# Patient Record
Sex: Female | Born: 1960 | Race: Black or African American | Hispanic: No | Marital: Married | Smoking: Former smoker
Health system: Southern US, Community
[De-identification: ages and names within clinical notes are randomized; demographics above are authoritative.]

## PROBLEM LIST (undated history)

## (undated) DIAGNOSIS — Z8639 Personal history of other endocrine, nutritional and metabolic disease: Secondary | ICD-10-CM

## (undated) DIAGNOSIS — K8020 Calculus of gallbladder without cholecystitis without obstruction: Secondary | ICD-10-CM

## (undated) DIAGNOSIS — E871 Hypo-osmolality and hyponatremia: Secondary | ICD-10-CM

## (undated) DIAGNOSIS — R202 Paresthesia of skin: Secondary | ICD-10-CM

## (undated) DIAGNOSIS — M1990 Unspecified osteoarthritis, unspecified site: Secondary | ICD-10-CM

## (undated) DIAGNOSIS — E119 Type 2 diabetes mellitus without complications: Secondary | ICD-10-CM

## (undated) DIAGNOSIS — F329 Major depressive disorder, single episode, unspecified: Secondary | ICD-10-CM

## (undated) DIAGNOSIS — K754 Autoimmune hepatitis: Secondary | ICD-10-CM

## (undated) DIAGNOSIS — F419 Anxiety disorder, unspecified: Secondary | ICD-10-CM

## (undated) DIAGNOSIS — L932 Other local lupus erythematosus: Secondary | ICD-10-CM

## (undated) DIAGNOSIS — K3532 Acute appendicitis with perforation and localized peritonitis, without abscess: Secondary | ICD-10-CM

## (undated) DIAGNOSIS — F32A Depression, unspecified: Secondary | ICD-10-CM

## (undated) DIAGNOSIS — I10 Essential (primary) hypertension: Secondary | ICD-10-CM

## (undated) DIAGNOSIS — E7800 Pure hypercholesterolemia, unspecified: Secondary | ICD-10-CM

## (undated) DIAGNOSIS — K760 Fatty (change of) liver, not elsewhere classified: Secondary | ICD-10-CM

## (undated) DIAGNOSIS — Z780 Asymptomatic menopausal state: Secondary | ICD-10-CM

## (undated) DIAGNOSIS — E876 Hypokalemia: Secondary | ICD-10-CM

## (undated) HISTORY — DX: Depression, unspecified: F32.A

## (undated) HISTORY — DX: Asymptomatic menopausal state: Z78.0

## (undated) HISTORY — DX: Major depressive disorder, single episode, unspecified: F32.9

## (undated) HISTORY — PX: CARPAL TUNNEL RELEASE: SHX101

## (undated) HISTORY — DX: Type 2 diabetes mellitus without complications: E11.9

## (undated) HISTORY — PX: TUBAL LIGATION: SHX77

## (undated) HISTORY — PX: JOINT REPLACEMENT: SHX530

## (undated) HISTORY — DX: Pure hypercholesterolemia, unspecified: E78.00

---

## 2006-02-06 ENCOUNTER — Ambulatory Visit: Payer: Self-pay | Admitting: Family Medicine

## 2012-05-15 ENCOUNTER — Ambulatory Visit: Payer: Self-pay | Admitting: Family Medicine

## 2014-06-24 ENCOUNTER — Ambulatory Visit: Payer: Self-pay | Admitting: Family Medicine

## 2015-08-20 ENCOUNTER — Other Ambulatory Visit: Payer: Self-pay | Admitting: Family Medicine

## 2015-08-20 DIAGNOSIS — Z1231 Encounter for screening mammogram for malignant neoplasm of breast: Secondary | ICD-10-CM

## 2015-10-09 ENCOUNTER — Ambulatory Visit
Admission: RE | Admit: 2015-10-09 | Discharge: 2015-10-09 | Disposition: A | Discharge status: Home / Self Care | Payer: No Typology Code available for payment source | Source: Ambulatory Visit | Attending: Family Medicine | Admitting: Family Medicine

## 2015-10-09 DIAGNOSIS — Z1231 Encounter for screening mammogram for malignant neoplasm of breast: Secondary | ICD-10-CM | POA: Diagnosis present

## 2016-04-07 ENCOUNTER — Inpatient Hospital Stay: Payer: Self-pay | Admitting: Oncology

## 2016-05-05 ENCOUNTER — Encounter: Payer: Self-pay | Admitting: Oncology

## 2016-05-05 ENCOUNTER — Inpatient Hospital Stay: Payer: BLUE CROSS/BLUE SHIELD | Attending: Oncology | Admitting: Oncology

## 2016-05-05 DIAGNOSIS — Z79899 Other long term (current) drug therapy: Secondary | ICD-10-CM | POA: Diagnosis not present

## 2016-05-05 DIAGNOSIS — Z8041 Family history of malignant neoplasm of ovary: Secondary | ICD-10-CM | POA: Diagnosis not present

## 2016-05-05 DIAGNOSIS — Z1371 Encounter for nonprocreative screening for genetic disease carrier status: Secondary | ICD-10-CM | POA: Diagnosis not present

## 2016-05-05 DIAGNOSIS — E7800 Pure hypercholesterolemia, unspecified: Secondary | ICD-10-CM | POA: Insufficient documentation

## 2016-05-05 DIAGNOSIS — E119 Type 2 diabetes mellitus without complications: Secondary | ICD-10-CM | POA: Insufficient documentation

## 2016-05-05 DIAGNOSIS — Z7984 Long term (current) use of oral hypoglycemic drugs: Secondary | ICD-10-CM | POA: Diagnosis not present

## 2016-05-05 DIAGNOSIS — F329 Major depressive disorder, single episode, unspecified: Secondary | ICD-10-CM | POA: Insufficient documentation

## 2016-05-05 NOTE — Progress Notes (Signed)
States is feeling well. Offers no complaints. 

## 2016-05-21 NOTE — Progress Notes (Signed)
[place]  Telephone:(336) [phone] Fax:(336) [phone]  ID: [name] OB: [birth year]  MR#: [id number]  [id number]  Patient Care Team: Herminio Commons, MD as PCP - General (Family Medicine)  CHIEF COMPLAINT: Genetic testing for family history of ovarian cancer. Chief Complaint  Patient presents with  . New Evaluation    INTERVAL HISTORY: Patient is a 55 year old female with no personal history of malignancy, but has a mother who had ovarian cancer in her 26s and her sister who had ovarian cancer in her 65s. She currently feels well and is asymptomatic. She has no neurologic complaints. She denies any fevers or illnesses. She has a good appetite and denies weight loss. She has no chest pain or shortness of breath. She denies any abdominal pain. She has no nausea, vomiting, constipation, or diarrhea. She has no urinary complaints. Patient feels at her baseline and offers no specific complaints today.  REVIEW OF SYSTEMS:   Review of Systems  Constitutional: Negative.  Negative for fever, weight loss and malaise/fatigue.  Respiratory: Negative for shortness of breath.   Cardiovascular: Negative.  Negative for chest pain.  Gastrointestinal: Negative.  Negative for abdominal pain.  Genitourinary: Negative.   Musculoskeletal: Negative.   Neurological: Negative.  Negative for weakness.  Psychiatric/Behavioral: Negative.     As per HPI. Otherwise, a complete review of systems is negatve.  PAST MEDICAL HISTORY: Past Medical History  Diagnosis Date  . Postmenopausal     LMP 2012  . Diabetes ([place])   . High cholesterol   . Depression     PAST SURGICAL HISTORY: Past Surgical History  Procedure Laterality Date  . Tubal ligation      FAMILY HISTORY Family History  Problem Relation Age of Onset  . Fibroids Sister 19  . Uterine cancer Sister 3  . Uterine cancer Mother 32  . Lung cancer Brother 97  . Bone cancer Brother 69       ADVANCED  DIRECTIVES:    HEALTH MAINTENANCE: Social History  Substance Use Topics  . Smoking status: Not on file  . Smokeless tobacco: Not on file  . Alcohol Use: Not on file     Colonoscopy:  PAP:  Bone density:  Lipid panel:  Allergies  Allergen Reactions  . Penicillin G Rash    Current Outpatient Prescriptions  Medication Sig Dispense Refill  . atorvastatin (LIPITOR) 40 MG tablet Take 1 tablet by mouth daily.  10  . metFORMIN (GLUCOPHAGE) 1000 MG tablet Take 1 tablet by mouth daily.  4  . sertraline (ZOLOFT) 50 MG tablet Take 1 tablet by mouth daily.  1   No current facility-administered medications for this visit.    OBJECTIVE: Filed Vitals:   05/05/16 1517  BP: 123/84  Pulse: 85  Temp: 95 F (35 C)  Resp: 18     There is no height on file to calculate BMI.    ECOG FS:0 - Asymptomatic  General: Well-developed, well-nourished, no acute distress. Eyes: Pink conjunctiva, anicteric sclera. Musculoskeletal: No edema, cyanosis, or clubbing. Neuro: Alert, answering all questions appropriately. Cranial nerves grossly intact. Skin: No rashes or petechiae noted. Psych: Normal affect.   LAB RESULTS:  No results found for: NA, K, CL, CO2, GLUCOSE, BUN, CREATININE, CALCIUM, PROT, ALBUMIN, AST, ALT, ALKPHOS, BILITOT, GFRNONAA, GFRAA  No results found for: WBC, NEUTROABS, HGB, HCT, MCV, PLT   STUDIES: No results found.  ASSESSMENT: Genetic testing for significant family history of ovarian cancer.   PLAN:    1.  Family history: Patient's mother had ovarian cancer in her 2s and her sister had ovarian cancer in her 23s. To her knowledge, neither have been genetically tested. She also had a brother with lung cancer at the age of 69 and a second brother with "bone cancer". Patient has no personal history of cancer. Patient will qualify for genetic testing and the My Risk panel has been ordered. If genetic testing is negative, patient expressed understanding that she still has a  higher risk of developing malignancy, particularly ovarian cancer, then the general population given her family history. If patient's genetic testing is positive, she has indicated that she likely would pursue prophylactic total hysterectomy and oophorectomy. If negative, no follow-up is necessary. If positive, patient will return to clinic to discuss the results as well as prophylactic measure she can take.  Approximately 45 minutes was spent in discussion of which greater than 50% was consultation.  Patient expressed understanding and was in agreement with this plan. She also understands that She can call clinic at any time with any questions, concerns, or complaints.   Lloyd Huger, MD   05/21/2016 6:26 PM

## 2016-08-03 ENCOUNTER — Other Ambulatory Visit: Payer: Self-pay | Admitting: Family Medicine

## 2016-08-03 DIAGNOSIS — R945 Abnormal results of liver function studies: Secondary | ICD-10-CM

## 2016-08-11 ENCOUNTER — Ambulatory Visit: Payer: BLUE CROSS/BLUE SHIELD

## 2016-08-12 ENCOUNTER — Ambulatory Visit
Admission: RE | Admit: 2016-08-12 | Discharge: 2016-08-12 | Disposition: A | Discharge status: Home / Self Care | Payer: BLUE CROSS/BLUE SHIELD | Source: Ambulatory Visit | Attending: Family Medicine | Admitting: Family Medicine

## 2016-08-12 DIAGNOSIS — R7989 Other specified abnormal findings of blood chemistry: Secondary | ICD-10-CM | POA: Diagnosis present

## 2016-08-12 DIAGNOSIS — R945 Abnormal results of liver function studies: Secondary | ICD-10-CM

## 2016-09-12 ENCOUNTER — Other Ambulatory Visit: Payer: Self-pay | Admitting: Family Medicine

## 2016-09-12 DIAGNOSIS — Z1231 Encounter for screening mammogram for malignant neoplasm of breast: Secondary | ICD-10-CM

## 2016-10-11 ENCOUNTER — Ambulatory Visit
Admission: RE | Admit: 2016-10-11 | Discharge: 2016-10-11 | Disposition: A | Discharge status: Home / Self Care | Payer: BLUE CROSS/BLUE SHIELD | Source: Ambulatory Visit | Attending: Family Medicine | Admitting: Family Medicine

## 2016-10-11 DIAGNOSIS — Z1231 Encounter for screening mammogram for malignant neoplasm of breast: Secondary | ICD-10-CM

## 2017-11-08 ENCOUNTER — Other Ambulatory Visit: Payer: Self-pay | Admitting: Family Medicine

## 2017-11-08 DIAGNOSIS — Z1231 Encounter for screening mammogram for malignant neoplasm of breast: Secondary | ICD-10-CM

## 2017-11-28 ENCOUNTER — Other Ambulatory Visit: Payer: Self-pay

## 2017-11-28 ENCOUNTER — Emergency Department
Admission: EM | Admit: 2017-11-28 | Discharge: 2017-11-28 | Disposition: A | Discharge status: Home / Self Care | Payer: BLUE CROSS/BLUE SHIELD | Attending: Emergency Medicine | Admitting: Emergency Medicine

## 2017-11-28 ENCOUNTER — Encounter: Payer: Self-pay | Admitting: Emergency Medicine

## 2017-11-28 DIAGNOSIS — K047 Periapical abscess without sinus: Secondary | ICD-10-CM | POA: Insufficient documentation

## 2017-11-28 DIAGNOSIS — R6884 Jaw pain: Secondary | ICD-10-CM | POA: Diagnosis present

## 2017-11-28 DIAGNOSIS — E119 Type 2 diabetes mellitus without complications: Secondary | ICD-10-CM | POA: Diagnosis not present

## 2017-11-28 LAB — GLUCOSE, CAPILLARY: GLUCOSE-CAPILLARY: 111 mg/dL — AB (ref 65–99)

## 2017-11-28 MED ORDER — CLINDAMYCIN HCL 150 MG PO CAPS
300.0000 mg | ORAL_CAPSULE | Freq: Once | ORAL | Status: AC
  Administered 2017-11-28: 300 mg via ORAL
  Filled 2017-11-28: qty 2

## 2017-11-28 MED ORDER — CLINDAMYCIN HCL 300 MG PO CAPS: 300.0000 mg | ORAL_CAPSULE | Freq: Three times a day (TID) | ORAL | 0 refills | Status: DC

## 2017-11-28 MED ORDER — OXYCODONE-ACETAMINOPHEN 5-325 MG PO TABS
2.0000 | ORAL_TABLET | Freq: Once | ORAL | Status: AC
  Administered 2017-11-28: 2 via ORAL
  Filled 2017-11-28: qty 2

## 2017-11-28 MED ORDER — TRAMADOL HCL 50 MG PO TABS: 50.0000 mg | ORAL_TABLET | Freq: Four times a day (QID) | ORAL | 0 refills | Status: AC | PRN

## 2017-11-28 NOTE — ED Notes (Signed)
Computer in room not working at time of discharge; unable to obtain electronic signature due to this reason.  Pt expresses verbal understanding of paper work w/ no further questions at this time.

## 2017-11-28 NOTE — ED Provider Notes (Signed)
[facility] Emergency Department Provider Note       Time seen: ----------------------------------------- 7:21 AM on 11/28/2017 -----------------------------------------   I have reviewed the triage vital signs and the nursing notes.  HISTORY   Chief Complaint Jaw Pain    HPI Jamie [name] is a 57 y.o. female with a history of diabetes, high cholesterol and depression who presents to the ED for right-sided jaw swelling and pain.  Pain is 8 out of 10, any pressure on her jaw makes her pain worse.  Pain seemed to start on Saturday and progressed into Sunday.  She presents for further evaluation.  She has a dentist appointment in 6 days.  Past Medical History:  Diagnosis Date  . Depression   . Diabetes ([place])   . High cholesterol   . Postmenopausal    LMP 2012    There are no active problems to display for this patient.   Past Surgical History:  Procedure Laterality Date  . TUBAL LIGATION      Allergies Penicillin g  Social History Social History   Tobacco Use  . Smoking status: Not on file  Substance Use Topics  . Alcohol use: Not on file  . Drug use: Not on file    Review of Systems Constitutional: Negative for fever. ENT: Positive for jaw pain and toothache Cardiovascular: Negative for chest pain. Respiratory: Negative for shortness of breath. Musculoskeletal: Negative for back pain. Skin: Positive for right-sided jaw swelling Neurological: Negative for headaches, focal weakness or numbness.  All systems negative/normal/unremarkable except as stated in the HPI  ____________________________________________   PHYSICAL EXAM:  VITAL SIGNS: ED Triage Vitals [11/28/17 0623]  Enc Vitals Group     BP      Pulse      Resp      Temp      Temp src      SpO2      Weight 174 lb (78.9 kg)     Height 5\' 6"  (1.676 m)     Head Circumference      Peak Flow      Pain Score 8     Pain Loc      Pain Edu?      Excl. in [place]?      Constitutional: Alert and oriented. Well appearing and in no distress. Eyes: Conjunctivae are normal. Normal extraocular movements. ENT   Head: Normocephalic and atraumatic.   Nose: No congestion/rhinnorhea.   Mouth/Throat: Widespread tooth decay, obvious dental fracture and dental caries.  Particular tenderness over tooth number 27 with likely periapical abscess formation.  Perimandibular erythema and swelling on the right anteriorly   Neck: No stridor, negative for mass or swelling Cardiovascular: Normal rate, regular rhythm. No murmurs, rubs, or gallops. Respiratory: Normal respiratory effort without tachypnea nor retractions. Breath sounds are clear and equal bilaterally. No wheezes/rales/rhonchi. Musculoskeletal: Nontender with normal range of motion in extremities. No lower extremity tenderness nor edema. Neurologic:  Normal speech and language. No gross focal neurologic deficits are appreciated.  Skin: Right-sided jaw swelling and erythema ____________________________________________  ED COURSE:  As part of my medical decision making, I reviewed the following data within the [place] History obtained from family if available, nursing notes, old chart and ekg, as well as notes from prior ED visits. Patient presented for an tubular swelling and pain, findings represent dental abscess   Procedures ____________________________________________  DIFFERENTIAL DIAGNOSIS   Dental abscess, tooth decay, hyperglycemia  FINAL ASSESSMENT AND PLAN  Dental abscess   Plan: Patient had presented for a dental abscess and received oral antibiotics and pain medicine. Patient's labs revealed normal glycemia.  I have encouraged her to schedule a sooner appointment with dentistry but overall she is stable for discharge with antibiotics and pain medicine.   Earleen Newport, MD   Note: This note was generated in part or whole with voice recognition  software. Voice recognition is usually quite accurate but there are transcription errors that can and very often do occur. I apologize for any typographical errors that were not detected and corrected.     Earleen Newport, MD 11/28/17 0800

## 2017-11-28 NOTE — ED Triage Notes (Signed)
Patient ambulatory to triage with steady gait, without difficulty or distress noted; pt reports awoke on Sunday with swelling to right lower jaw--redness noted

## 2018-05-22 ENCOUNTER — Other Ambulatory Visit: Payer: Self-pay | Admitting: Family Medicine

## 2018-05-22 DIAGNOSIS — Z1231 Encounter for screening mammogram for malignant neoplasm of breast: Secondary | ICD-10-CM

## 2018-06-15 ENCOUNTER — Ambulatory Visit
Admission: RE | Admit: 2018-06-15 | Discharge: 2018-06-15 | Disposition: A | Discharge status: Home / Self Care | Payer: BLUE CROSS/BLUE SHIELD | Source: Ambulatory Visit | Attending: Family Medicine | Admitting: Family Medicine

## 2018-06-15 DIAGNOSIS — Z1231 Encounter for screening mammogram for malignant neoplasm of breast: Secondary | ICD-10-CM | POA: Insufficient documentation

## 2019-05-09 ENCOUNTER — Other Ambulatory Visit: Payer: Self-pay | Admitting: Family Medicine

## 2019-05-09 DIAGNOSIS — Z1231 Encounter for screening mammogram for malignant neoplasm of breast: Secondary | ICD-10-CM

## 2019-06-20 ENCOUNTER — Ambulatory Visit
Admission: RE | Admit: 2019-06-20 | Discharge: 2019-06-20 | Disposition: A | Discharge status: Home / Self Care | Payer: BLUE CROSS/BLUE SHIELD | Source: Ambulatory Visit | Attending: Family Medicine | Admitting: Family Medicine

## 2019-06-20 ENCOUNTER — Other Ambulatory Visit: Payer: Self-pay

## 2019-06-20 DIAGNOSIS — Z1231 Encounter for screening mammogram for malignant neoplasm of breast: Secondary | ICD-10-CM | POA: Insufficient documentation

## 2019-08-28 ENCOUNTER — Encounter
Admission: RE | Admit: 2019-08-28 | Discharge: 2019-08-28 | Disposition: A | Discharge status: Home / Self Care | Payer: BLUE CROSS/BLUE SHIELD | Source: Ambulatory Visit | Attending: Orthopedic Surgery | Admitting: Orthopedic Surgery

## 2019-08-28 ENCOUNTER — Other Ambulatory Visit: Payer: Self-pay

## 2019-08-28 DIAGNOSIS — Z01818 Encounter for other preprocedural examination: Secondary | ICD-10-CM | POA: Diagnosis not present

## 2019-08-28 DIAGNOSIS — E118 Type 2 diabetes mellitus with unspecified complications: Secondary | ICD-10-CM | POA: Diagnosis not present

## 2019-08-28 DIAGNOSIS — E785 Hyperlipidemia, unspecified: Secondary | ICD-10-CM | POA: Diagnosis not present

## 2019-08-28 DIAGNOSIS — Z01810 Encounter for preprocedural cardiovascular examination: Secondary | ICD-10-CM | POA: Diagnosis not present

## 2019-08-28 DIAGNOSIS — I10 Essential (primary) hypertension: Secondary | ICD-10-CM | POA: Diagnosis not present

## 2019-08-28 HISTORY — DX: Unspecified osteoarthritis, unspecified site: M19.90

## 2019-08-28 HISTORY — DX: Essential (primary) hypertension: I10

## 2019-08-28 LAB — URINALYSIS, ROUTINE W REFLEX MICROSCOPIC
Bacteria, UA: NONE SEEN
Bilirubin Urine: NEGATIVE
Glucose, UA: NEGATIVE mg/dL
Hgb urine dipstick: NEGATIVE
Ketones, ur: NEGATIVE mg/dL
Nitrite: NEGATIVE
Protein, ur: NEGATIVE mg/dL
Specific Gravity, Urine: 1.032 — ABNORMAL HIGH (ref 1.005–1.030)
pH: 5 (ref 5.0–8.0)

## 2019-08-28 LAB — BASIC METABOLIC PANEL
Anion gap: 9 (ref 5–15)
BUN: 20 mg/dL (ref 6–20)
CO2: 29 mmol/L (ref 22–32)
Calcium: 9.6 mg/dL (ref 8.9–10.3)
Chloride: 101 mmol/L (ref 98–111)
Creatinine, Ser: 0.75 mg/dL (ref 0.44–1.00)
GFR calc Af Amer: >60 mL/min (ref >60)
GFR calc non Af Amer: >60 mL/min (ref >60)
Glucose, Bld: 82 mg/dL (ref 70–99)
Potassium: 4.2 mmol/L (ref 3.5–5.1)
Sodium: 139 mmol/L (ref 135–145)

## 2019-08-28 LAB — PROTIME-INR
INR: 0.9 (ref 0.8–1.2)
Prothrombin Time: 12.5 seconds (ref 11.4–15.2)

## 2019-08-28 LAB — TYPE AND SCREEN
ABO/RH(D): A NEG
Antibody Screen: NEGATIVE

## 2019-08-28 LAB — SURGICAL PCR SCREEN
MRSA, PCR: NEGATIVE
Staphylococcus aureus: NEGATIVE

## 2019-08-28 LAB — CBC
HCT: 36.1 % (ref 36.0–46.0)
Hemoglobin: 11.6 g/dL — ABNORMAL LOW (ref 12.0–15.0)
MCH: 27.8 pg (ref 26.0–34.0)
MCHC: 32.1 g/dL (ref 30.0–36.0)
MCV: 86.4 fL (ref 80.0–100.0)
Platelets: 238 10*3/uL (ref 150–400)
RBC: 4.18 MIL/uL (ref 3.87–5.11)
RDW: 12.6 % (ref 11.5–15.5)
WBC: 4.6 10*3/uL (ref 4.0–10.5)
nRBC: 0 % (ref 0.0–0.2)

## 2019-08-28 LAB — APTT: aPTT: 30 seconds (ref 24–36)

## 2019-08-28 LAB — SEDIMENTATION RATE: Sed Rate: 53 mm/hr — ABNORMAL HIGH (ref 0–30)

## 2019-08-28 NOTE — Patient Instructions (Signed)
Your procedure is scheduled on: 09/10/19 Report to [place]. To find out your arrival time please call [phone] between 1PM - 3PM on 1019/20.  Remember: Instructions that are not followed completely may result in serious medical risk, up to and including death, or upon the discretion of your surgeon and anesthesiologist your surgery may need to be rescheduled.     _X__ 1. Do not eat food after midnight the night before your procedure.                 No gum chewing or hard candies. You may drink clear liquids up to 2 hours                 before you are scheduled to arrive for your surgery- DO not drink clear                 liquids within 2 hours of the start of your surgery.                 Clear Liquids include:  water, apple juice without pulp, clear carbohydrate                 drink such as Clearfast or Gatorade, Black Coffee or Tea (Do not add                 anything to coffee or tea). Diabetics water only  __X__2.  On the morning of surgery brush your teeth with toothpaste and water, you                 may rinse your mouth with mouthwash if you wish.  Do not swallow any              toothpaste of mouthwash.     _X__ 3.  No Alcohol for 24 hours before or after surgery.   _X__ 4.  Do Not Smoke or use e-cigarettes For 24 Hours Prior to Your Surgery.                 Do not use any chewable tobacco products for at least 6 hours prior to                 surgery.  ____  5.  Bring all medications with you on the day of surgery if instructed.   __X__  6.  Notify your doctor if there is any change in your medical condition      (cold, fever, infections).     Do not wear jewelry, make-up, hairpins, clips or nail polish. Do not wear lotions, powders, or perfumes.  Do not shave 48 hours prior to surgery. Men may shave face and neck. Do not bring valuables to the hospital.    [facility] is not responsible for any belongings  or valuables.  Contacts, dentures/partials or body piercings may not be worn into surgery. Bring a case for your contacts, glasses or hearing aids, a denture cup will be supplied. Leave your suitcase in the car. After surgery it may be brought to your room. For patients admitted to the hospital, discharge time is determined by your treatment team.   Patients discharged the day of surgery will not be allowed to drive home.   Please read over the following fact sheets that you were given:   MRSA Information  __X__ Take these medicines the morning of surgery with A SIP OF WATER:  1. PARoxetine (PAXIL  2.   3.   4.  5.  6.  ____ Fleet Enema (as directed)   __X__ Use CHG Soap/SAGE wipes as directed  ____ Use inhalers on the day of surgery  __X__ Stop metformin/Janumet/Farxiga 2 days prior to surgery    ____ Take 1/2 of usual insulin dose the night before surgery. No insulin the morning          of surgery.   ____ Stop Blood Thinners Coumadin/Plavix/Xarelto/Pleta/Pradaxa/Eliquis/Effient/Aspirin  on   Or contact your Surgeon, Cardiologist or Medical Doctor regarding  ability to stop your blood thinners  __X__ Stop Anti-inflammatories 7 days before surgery such as Advil, Ibuprofen, Motrin,  BC or Goodies Powder, Naprosyn, Naproxen, Aleve, Aspirin   dICLOFENAC   __X__ Stop all herbal supplements, fish oil or vitamin E until after surgery.  VITAMIN D OK TO CONTINUE  ____ Bring C-Pap to the hospital.

## 2019-08-29 LAB — URINE CULTURE: Culture: NO GROWTH

## 2019-09-06 ENCOUNTER — Other Ambulatory Visit
Admission: RE | Admit: 2019-09-06 | Discharge: 2019-09-06 | Disposition: A | Discharge status: Home / Self Care | Payer: BLUE CROSS/BLUE SHIELD | Source: Ambulatory Visit | Attending: Orthopedic Surgery | Admitting: Orthopedic Surgery

## 2019-09-06 ENCOUNTER — Other Ambulatory Visit: Payer: Self-pay

## 2019-09-06 DIAGNOSIS — Z20828 Contact with and (suspected) exposure to other viral communicable diseases: Secondary | ICD-10-CM | POA: Insufficient documentation

## 2019-09-06 LAB — SARS CORONAVIRUS 2 (TAT 6-24 HRS): SARS Coronavirus 2: NEGATIVE

## 2019-09-10 ENCOUNTER — Encounter
Admission: RE | Disposition: A | Discharge status: Home Health | Payer: Self-pay | Source: Home / Self Care | Attending: Orthopedic Surgery

## 2019-09-10 ENCOUNTER — Inpatient Hospital Stay: Payer: BLUE CROSS/BLUE SHIELD | Admitting: Certified Registered"

## 2019-09-10 ENCOUNTER — Other Ambulatory Visit: Payer: Self-pay

## 2019-09-10 ENCOUNTER — Inpatient Hospital Stay: Payer: BLUE CROSS/BLUE SHIELD

## 2019-09-10 ENCOUNTER — Inpatient Hospital Stay
Admission: RE | Admit: 2019-09-10 | Discharge: 2019-09-12 | DRG: 470 | Disposition: A | Discharge status: Home Health | Payer: BLUE CROSS/BLUE SHIELD | Attending: Orthopedic Surgery | Admitting: Orthopedic Surgery

## 2019-09-10 DIAGNOSIS — Z8249 Family history of ischemic heart disease and other diseases of the circulatory system: Secondary | ICD-10-CM

## 2019-09-10 DIAGNOSIS — Z79899 Other long term (current) drug therapy: Secondary | ICD-10-CM | POA: Diagnosis not present

## 2019-09-10 DIAGNOSIS — I10 Essential (primary) hypertension: Secondary | ICD-10-CM | POA: Diagnosis present

## 2019-09-10 DIAGNOSIS — Z87891 Personal history of nicotine dependence: Secondary | ICD-10-CM | POA: Diagnosis not present

## 2019-09-10 DIAGNOSIS — E119 Type 2 diabetes mellitus without complications: Secondary | ICD-10-CM | POA: Diagnosis present

## 2019-09-10 DIAGNOSIS — E7800 Pure hypercholesterolemia, unspecified: Secondary | ICD-10-CM | POA: Diagnosis present

## 2019-09-10 DIAGNOSIS — Z880 Allergy status to penicillin: Secondary | ICD-10-CM

## 2019-09-10 DIAGNOSIS — Z881 Allergy status to other antibiotic agents status: Secondary | ICD-10-CM | POA: Diagnosis not present

## 2019-09-10 DIAGNOSIS — G8918 Other acute postprocedural pain: Secondary | ICD-10-CM

## 2019-09-10 DIAGNOSIS — M1612 Unilateral primary osteoarthritis, left hip: Principal | ICD-10-CM | POA: Diagnosis present

## 2019-09-10 DIAGNOSIS — Z23 Encounter for immunization: Secondary | ICD-10-CM | POA: Diagnosis not present

## 2019-09-10 DIAGNOSIS — Z96642 Presence of left artificial hip joint: Secondary | ICD-10-CM

## 2019-09-10 DIAGNOSIS — Z7984 Long term (current) use of oral hypoglycemic drugs: Secondary | ICD-10-CM | POA: Diagnosis not present

## 2019-09-10 DIAGNOSIS — E785 Hyperlipidemia, unspecified: Secondary | ICD-10-CM | POA: Diagnosis present

## 2019-09-10 DIAGNOSIS — Z419 Encounter for procedure for purposes other than remedying health state, unspecified: Secondary | ICD-10-CM

## 2019-09-10 HISTORY — PX: TOTAL HIP ARTHROPLASTY: SHX124

## 2019-09-10 LAB — CBC
HCT: 33.5 % — ABNORMAL LOW (ref 36.0–46.0)
Hemoglobin: 10.5 g/dL — ABNORMAL LOW (ref 12.0–15.0)
MCH: 27.6 pg (ref 26.0–34.0)
MCHC: 31.3 g/dL (ref 30.0–36.0)
MCV: 88.2 fL (ref 80.0–100.0)
Platelets: 192 10*3/uL (ref 150–400)
RBC: 3.8 MIL/uL — ABNORMAL LOW (ref 3.87–5.11)
RDW: 12.4 % (ref 11.5–15.5)
WBC: 12.5 10*3/uL — ABNORMAL HIGH (ref 4.0–10.5)
nRBC: 0 % (ref 0.0–0.2)

## 2019-09-10 LAB — ABO/RH: ABO/RH(D): A NEG

## 2019-09-10 LAB — CREATININE, SERUM
Creatinine, Ser: 0.59 mg/dL (ref 0.44–1.00)
GFR calc Af Amer: >60 mL/min (ref >60)
GFR calc non Af Amer: >60 mL/min (ref >60)

## 2019-09-10 LAB — GLUCOSE, CAPILLARY
Glucose-Capillary: 106 mg/dL — ABNORMAL HIGH (ref 70–99)
Glucose-Capillary: 107 mg/dL — ABNORMAL HIGH (ref 70–99)
Glucose-Capillary: 125 mg/dL — ABNORMAL HIGH (ref 70–99)
Glucose-Capillary: 132 mg/dL — ABNORMAL HIGH (ref 70–99)

## 2019-09-10 SURGERY — ARTHROPLASTY, HIP, TOTAL, ANTERIOR APPROACH
Anesthesia: Spinal | Site: Hip | Laterality: Left

## 2019-09-10 MED ORDER — HYDROCODONE-ACETAMINOPHEN 7.5-325 MG PO TABS: 1.0000 | ORAL_TABLET | ORAL | Status: DC | PRN

## 2019-09-10 MED ORDER — SODIUM CHLORIDE 0.9 % IV SOLN
INTRAVENOUS | Status: DC
  Administered 2019-09-10 – 2019-09-11 (×2): via INTRAVENOUS

## 2019-09-10 MED ORDER — PANTOPRAZOLE SODIUM 40 MG PO TBEC
40.0000 mg | DELAYED_RELEASE_TABLET | Freq: Every day | ORAL | Status: DC
  Administered 2019-09-10 – 2019-09-12 (×3): 40 mg via ORAL
  Filled 2019-09-10 (×3): qty 1

## 2019-09-10 MED ORDER — GLYCOPYRROLATE 0.2 MG/ML IJ SOLN
INTRAMUSCULAR | Status: AC
  Filled 2019-09-10: qty 2

## 2019-09-10 MED ORDER — INSULIN ASPART 100 UNIT/ML ~~LOC~~ SOLN: 0.0000 [IU] | Freq: Three times a day (TID) | SUBCUTANEOUS | Status: DC

## 2019-09-10 MED ORDER — ENOXAPARIN SODIUM 40 MG/0.4ML ~~LOC~~ SOLN
40.0000 mg | SUBCUTANEOUS | Status: DC
  Administered 2019-09-11 – 2019-09-12 (×2): 40 mg via SUBCUTANEOUS
  Filled 2019-09-10 (×2): qty 0.4

## 2019-09-10 MED ORDER — NEOMYCIN-POLYMYXIN B GU 40-200000 IR SOLN
Status: DC | PRN
  Administered 2019-09-10: 4 mL

## 2019-09-10 MED ORDER — ONDANSETRON HCL 4 MG/2ML IJ SOLN: 4.0000 mg | Freq: Four times a day (QID) | INTRAMUSCULAR | Status: DC | PRN

## 2019-09-10 MED ORDER — MORPHINE SULFATE (PF) 2 MG/ML IV SOLN: 0.5000 mg | INTRAVENOUS | Status: DC | PRN

## 2019-09-10 MED ORDER — ALUM & MAG HYDROXIDE-SIMETH 200-200-20 MG/5ML PO SUSP: 30.0000 mL | ORAL | Status: DC | PRN

## 2019-09-10 MED ORDER — METHOCARBAMOL 500 MG PO TABS: 500.0000 mg | ORAL_TABLET | Freq: Four times a day (QID) | ORAL | Status: DC | PRN

## 2019-09-10 MED ORDER — METOCLOPRAMIDE HCL 10 MG PO TABS: 5.0000 mg | ORAL_TABLET | Freq: Three times a day (TID) | ORAL | Status: DC | PRN

## 2019-09-10 MED ORDER — BUPIVACAINE LIPOSOME 1.3 % IJ SUSP
INTRAMUSCULAR | Status: AC
  Filled 2019-09-10: qty 20

## 2019-09-10 MED ORDER — BUPIVACAINE HCL (PF) 0.25 % IJ SOLN
INTRAMUSCULAR | Status: AC
  Filled 2019-09-10: qty 30

## 2019-09-10 MED ORDER — SODIUM CHLORIDE 0.9 % IV SOLN
INTRAVENOUS | Status: DC | PRN
  Administered 2019-09-10: 60 mL

## 2019-09-10 MED ORDER — FAMOTIDINE 20 MG PO TABS
20.0000 mg | ORAL_TABLET | Freq: Once | ORAL | Status: AC
  Administered 2019-09-10: 09:00:00 20 mg via ORAL

## 2019-09-10 MED ORDER — PHENYLEPHRINE HCL (PRESSORS) 10 MG/ML IV SOLN
INTRAVENOUS | Status: DC | PRN
  Administered 2019-09-10 (×4): 200 ug via INTRAVENOUS
  Administered 2019-09-10 (×3): 100 ug via INTRAVENOUS
  Administered 2019-09-10 (×3): 200 ug via INTRAVENOUS

## 2019-09-10 MED ORDER — ONDANSETRON HCL 4 MG/2ML IJ SOLN
INTRAMUSCULAR | Status: AC
  Filled 2019-09-10: qty 2

## 2019-09-10 MED ORDER — FENTANYL CITRATE (PF) 100 MCG/2ML IJ SOLN: 25.0000 ug | INTRAMUSCULAR | Status: DC | PRN

## 2019-09-10 MED ORDER — BUPIVACAINE HCL (PF) 0.5 % IJ SOLN
INTRAMUSCULAR | Status: DC | PRN
  Administered 2019-09-10: 3 mL via INTRATHECAL

## 2019-09-10 MED ORDER — PHENOL 1.4 % MT LIQD: 1.0000 | OROMUCOSAL | Status: DC | PRN

## 2019-09-10 MED ORDER — PROPOFOL 500 MG/50ML IV EMUL
INTRAVENOUS | Status: AC
  Filled 2019-09-10: qty 50

## 2019-09-10 MED ORDER — PAROXETINE HCL 10 MG PO TABS
10.0000 mg | ORAL_TABLET | Freq: Every day | ORAL | Status: DC
  Administered 2019-09-10 – 2019-09-12 (×3): 10 mg via ORAL
  Filled 2019-09-10 (×3): qty 1

## 2019-09-10 MED ORDER — MAGNESIUM CITRATE PO SOLN
1.0000 | Freq: Once | ORAL | Status: AC | PRN
  Administered 2019-09-12: 1 via ORAL
  Filled 2019-09-10: qty 296

## 2019-09-10 MED ORDER — ONDANSETRON HCL 4 MG PO TABS: 4.0000 mg | ORAL_TABLET | Freq: Four times a day (QID) | ORAL | Status: DC | PRN

## 2019-09-10 MED ORDER — DOCUSATE SODIUM 100 MG PO CAPS
100.0000 mg | ORAL_CAPSULE | Freq: Two times a day (BID) | ORAL | Status: DC
  Administered 2019-09-10 – 2019-09-12 (×4): 100 mg via ORAL
  Filled 2019-09-10 (×5): qty 1

## 2019-09-10 MED ORDER — NEOMYCIN-POLYMYXIN B GU 40-200000 IR SOLN
Status: AC
  Filled 2019-09-10: qty 20

## 2019-09-10 MED ORDER — EPINEPHRINE PF 1 MG/ML IJ SOLN
INTRAMUSCULAR | Status: AC
  Filled 2019-09-10: qty 1

## 2019-09-10 MED ORDER — METFORMIN HCL 500 MG PO TABS
1000.0000 mg | ORAL_TABLET | Freq: Every day | ORAL | Status: DC
  Administered 2019-09-11 – 2019-09-12 (×2): 1000 mg via ORAL
  Filled 2019-09-10 (×2): qty 2

## 2019-09-10 MED ORDER — BUPIVACAINE-EPINEPHRINE 0.25% -1:200000 IJ SOLN
INTRAMUSCULAR | Status: DC | PRN
  Administered 2019-09-10: 30 mL

## 2019-09-10 MED ORDER — INFLUENZA VAC SPLIT QUAD 0.5 ML IM SUSY
0.5000 mL | PREFILLED_SYRINGE | INTRAMUSCULAR | Status: AC
  Administered 2019-09-11: 0.5 mL via INTRAMUSCULAR
  Filled 2019-09-10: qty 0.5

## 2019-09-10 MED ORDER — MIDAZOLAM HCL 5 MG/5ML IJ SOLN
INTRAMUSCULAR | Status: DC | PRN
  Administered 2019-09-10 (×2): 1 mg via INTRAVENOUS

## 2019-09-10 MED ORDER — MAGNESIUM HYDROXIDE 400 MG/5ML PO SUSP
30.0000 mL | Freq: Every day | ORAL | Status: DC | PRN
  Administered 2019-09-11 (×2): 30 mL via ORAL
  Filled 2019-09-10 (×2): qty 30

## 2019-09-10 MED ORDER — HYDROCODONE-ACETAMINOPHEN 5-325 MG PO TABS
1.0000 | ORAL_TABLET | ORAL | Status: DC | PRN
  Administered 2019-09-10 – 2019-09-12 (×4): 2 via ORAL
  Filled 2019-09-10 (×4): qty 2

## 2019-09-10 MED ORDER — BISACODYL 10 MG RE SUPP
10.0000 mg | Freq: Every day | RECTAL | Status: DC | PRN
  Administered 2019-09-12: 10 mg via RECTAL
  Filled 2019-09-10: qty 1

## 2019-09-10 MED ORDER — ONDANSETRON HCL 4 MG/2ML IJ SOLN: 4.0000 mg | Freq: Once | INTRAMUSCULAR | Status: DC | PRN

## 2019-09-10 MED ORDER — ATORVASTATIN CALCIUM 20 MG PO TABS
40.0000 mg | ORAL_TABLET | Freq: Every day | ORAL | Status: DC
  Administered 2019-09-10 – 2019-09-11 (×2): 40 mg via ORAL
  Filled 2019-09-10: qty 2
  Filled 2019-09-10: qty 4

## 2019-09-10 MED ORDER — LISINOPRIL 10 MG PO TABS
10.0000 mg | ORAL_TABLET | Freq: Every day | ORAL | Status: DC
  Administered 2019-09-10 – 2019-09-12 (×3): 10 mg via ORAL
  Filled 2019-09-10 (×3): qty 1

## 2019-09-10 MED ORDER — METHOCARBAMOL 1000 MG/10ML IJ SOLN
500.0000 mg | Freq: Four times a day (QID) | INTRAVENOUS | Status: DC | PRN
  Filled 2019-09-10: qty 5

## 2019-09-10 MED ORDER — PROPOFOL 500 MG/50ML IV EMUL
INTRAVENOUS | Status: DC | PRN
  Administered 2019-09-10: 75 ug/kg/min via INTRAVENOUS

## 2019-09-10 MED ORDER — SODIUM CHLORIDE (PF) 0.9 % IJ SOLN
INTRAMUSCULAR | Status: AC
  Filled 2019-09-10: qty 50

## 2019-09-10 MED ORDER — VITAMIN D 25 MCG (1000 UNIT) PO TABS
1000.0000 [IU] | ORAL_TABLET | Freq: Every day | ORAL | Status: DC
  Administered 2019-09-11 – 2019-09-12 (×2): 1000 [IU] via ORAL
  Filled 2019-09-10 (×2): qty 1

## 2019-09-10 MED ORDER — VANCOMYCIN HCL IN DEXTROSE 1-5 GM/200ML-% IV SOLN
INTRAVENOUS | Status: AC
  Filled 2019-09-10: qty 200

## 2019-09-10 MED ORDER — GLYCOPYRROLATE 0.2 MG/ML IJ SOLN
INTRAMUSCULAR | Status: DC | PRN
  Administered 2019-09-10: 0.2 mg via INTRAVENOUS

## 2019-09-10 MED ORDER — VANCOMYCIN HCL IN DEXTROSE 1-5 GM/200ML-% IV SOLN
1000.0000 mg | Freq: Two times a day (BID) | INTRAVENOUS | Status: AC
  Administered 2019-09-10: 1000 mg via INTRAVENOUS
  Filled 2019-09-10: qty 200

## 2019-09-10 MED ORDER — FENTANYL CITRATE (PF) 100 MCG/2ML IJ SOLN
INTRAMUSCULAR | Status: DC | PRN
  Administered 2019-09-10 (×2): 50 ug via INTRAVENOUS

## 2019-09-10 MED ORDER — LIDOCAINE HCL (PF) 2 % IJ SOLN
INTRAMUSCULAR | Status: AC
  Filled 2019-09-10: qty 10

## 2019-09-10 MED ORDER — MENTHOL 3 MG MT LOZG: 1.0000 | LOZENGE | OROMUCOSAL | Status: DC | PRN

## 2019-09-10 MED ORDER — FENTANYL CITRATE (PF) 100 MCG/2ML IJ SOLN
INTRAMUSCULAR | Status: AC
  Filled 2019-09-10: qty 2

## 2019-09-10 MED ORDER — VANCOMYCIN HCL IN DEXTROSE 1-5 GM/200ML-% IV SOLN
1000.0000 mg | Freq: Once | INTRAVENOUS | Status: AC
  Administered 2019-09-10: 10:00:00 1000 mg via INTRAVENOUS

## 2019-09-10 MED ORDER — FAMOTIDINE 20 MG PO TABS
ORAL_TABLET | ORAL | Status: AC
  Administered 2019-09-10: 09:00:00 20 mg via ORAL
  Filled 2019-09-10: qty 1

## 2019-09-10 MED ORDER — TRANEXAMIC ACID-NACL 1000-0.7 MG/100ML-% IV SOLN
1000.0000 mg | INTRAVENOUS | Status: AC
  Administered 2019-09-10: 10:00:00 1000 mg via INTRAVENOUS
  Filled 2019-09-10: qty 100

## 2019-09-10 MED ORDER — ACETAMINOPHEN 325 MG PO TABS: 325.0000 mg | ORAL_TABLET | Freq: Four times a day (QID) | ORAL | Status: DC | PRN

## 2019-09-10 MED ORDER — GLYCOPYRROLATE 0.2 MG/ML IJ SOLN
INTRAMUSCULAR | Status: AC
  Filled 2019-09-10: qty 1

## 2019-09-10 MED ORDER — TRAMADOL HCL 50 MG PO TABS
50.0000 mg | ORAL_TABLET | Freq: Four times a day (QID) | ORAL | Status: DC
  Administered 2019-09-10 – 2019-09-12 (×8): 50 mg via ORAL
  Filled 2019-09-10 (×8): qty 1

## 2019-09-10 MED ORDER — MIDAZOLAM HCL 2 MG/2ML IJ SOLN
INTRAMUSCULAR | Status: AC
  Filled 2019-09-10: qty 2

## 2019-09-10 MED ORDER — METOCLOPRAMIDE HCL 5 MG/ML IJ SOLN: 5.0000 mg | Freq: Three times a day (TID) | INTRAMUSCULAR | Status: DC | PRN

## 2019-09-10 MED ORDER — BUPIVACAINE HCL (PF) 0.5 % IJ SOLN
INTRAMUSCULAR | Status: AC
  Filled 2019-09-10: qty 10

## 2019-09-10 MED ORDER — SODIUM CHLORIDE 0.9 % IV SOLN
INTRAVENOUS | Status: DC
  Administered 2019-09-10: 09:00:00 via INTRAVENOUS

## 2019-09-10 MED ORDER — ZOLPIDEM TARTRATE 5 MG PO TABS: 5.0000 mg | ORAL_TABLET | Freq: Every evening | ORAL | Status: DC | PRN

## 2019-09-10 SURGICAL SUPPLY — 62 items
BLADE SAGITTAL AGGR TOOTH XLG (BLADE) ×3 IMPLANT
BNDG COHESIVE 6X5 TAN STRL LF (GAUZE/BANDAGES/DRESSINGS) ×9 IMPLANT
CANISTER SUCT 1200ML W/VALVE (MISCELLANEOUS) ×3 IMPLANT
CANISTER WOUND CARE 500ML ATS (WOUND CARE) ×3 IMPLANT
CHLORAPREP W/TINT 26 (MISCELLANEOUS) ×3 IMPLANT
COVER BACK TABLE REUSABLE LG (DRAPES) ×3 IMPLANT
COVER WAND RF STERILE (DRAPES) ×3 IMPLANT
DRAPE 3/4 80X56 (DRAPES) ×9 IMPLANT
DRAPE C-ARM XRAY 36X54 (DRAPES) ×3 IMPLANT
DRAPE INCISE IOBAN 66X60 STRL (DRAPES) IMPLANT
DRAPE POUCH INSTRU U-SHP 10X18 (DRAPES) ×3 IMPLANT
DRESSING SURGICEL FIBRLLR 1X2 (HEMOSTASIS) ×2 IMPLANT
DRSG OPSITE POSTOP 4X8 (GAUZE/BANDAGES/DRESSINGS) ×6 IMPLANT
DRSG SURGICEL FIBRILLAR 1X2 (HEMOSTASIS) ×6
ELECT BLADE 6.5 EXT (BLADE) ×3 IMPLANT
ELECT REM PT RETURN 9FT ADLT (ELECTROSURGICAL) ×3
ELECTRODE REM PT RTRN 9FT ADLT (ELECTROSURGICAL) ×1 IMPLANT
GLOVE BIOGEL PI IND STRL 9 (GLOVE) ×1 IMPLANT
GLOVE BIOGEL PI INDICATOR 9 (GLOVE) ×2
GLOVE SURG SYN 9.0  PF PI (GLOVE) ×4
GLOVE SURG SYN 9.0 PF PI (GLOVE) ×2 IMPLANT
GOWN SRG 2XL LVL 4 RGLN SLV (GOWNS) ×1 IMPLANT
GOWN STRL NON-REIN 2XL LVL4 (GOWNS) ×2
GOWN STRL REUS W/ TWL LRG LVL3 (GOWN DISPOSABLE) ×1 IMPLANT
GOWN STRL REUS W/TWL LRG LVL3 (GOWN DISPOSABLE) ×2
HEAD FEMORAL 28MM SZ S (Head) ×2 IMPLANT
HEMOVAC 400CC 10FR (MISCELLANEOUS) IMPLANT
HOLDER FOLEY CATH W/STRAP (MISCELLANEOUS) ×3 IMPLANT
HOOD PEEL AWAY FLYTE STAYCOOL (MISCELLANEOUS) ×3 IMPLANT
KIT PREVENA INCISION MGT 13 (CANNISTER) ×3 IMPLANT
LINER DM 28MM (Liner) ×2 IMPLANT
MAT ABSORB  FLUID 56X50 GRAY (MISCELLANEOUS) ×2
MAT ABSORB FLUID 56X50 GRAY (MISCELLANEOUS) ×1 IMPLANT
NDL SAFETY ECLIPSE 18X1.5 (NEEDLE) ×1 IMPLANT
NDL SPNL 18GX3.5 QUINCKE PK (NEEDLE) IMPLANT
NDL SPNL 20GX3.5 QUINCKE YW (NEEDLE) ×2 IMPLANT
NEEDLE HYPO 18GX1.5 SHARP (NEEDLE) ×2
NEEDLE SPNL 18GX3.5 QUINCKE PK (NEEDLE) ×3 IMPLANT
NEEDLE SPNL 20GX3.5 QUINCKE YW (NEEDLE) ×6 IMPLANT
NS IRRIG 1000ML POUR BTL (IV SOLUTION) ×3 IMPLANT
PACK HIP COMPR (MISCELLANEOUS) ×3 IMPLANT
SCALPEL PROTECTED #10 DISP (BLADE) ×6 IMPLANT
SHELL ACETABULAR DM  48MM (Shell) ×2 IMPLANT
SOL PREP PVP 2OZ (MISCELLANEOUS) ×3
SOLUTION PREP PVP 2OZ (MISCELLANEOUS) ×1 IMPLANT
SPONGE DRAIN TRACH 4X4 STRL 2S (GAUZE/BANDAGES/DRESSINGS) ×3 IMPLANT
STAPLER SKIN PROX 35W (STAPLE) ×3 IMPLANT
STEM FEMORAL 1 STD COLLARED (Stem) ×2 IMPLANT
STRAP SAFETY 5IN WIDE (MISCELLANEOUS) ×3 IMPLANT
SUT DVC 2 QUILL PDO  T11 36X36 (SUTURE) ×2
SUT DVC 2 QUILL PDO T11 36X36 (SUTURE) ×1 IMPLANT
SUT SILK 0 (SUTURE) ×2
SUT SILK 0 30XBRD TIE 6 (SUTURE) ×1 IMPLANT
SUT V-LOC 90 ABS DVC 3-0 CL (SUTURE) ×3 IMPLANT
SUT VIC AB 1 CT1 36 (SUTURE) ×3 IMPLANT
SYR 20ML LL LF (SYRINGE) ×3 IMPLANT
SYR 30ML LL (SYRINGE) ×3 IMPLANT
SYR 50ML LL SCALE MARK (SYRINGE) ×6 IMPLANT
SYR BULB IRRIG 60ML STRL (SYRINGE) ×3 IMPLANT
TAPE MICROFOAM 4IN (TAPE) ×3 IMPLANT
TOWEL OR 17X26 4PK STRL BLUE (TOWEL DISPOSABLE) ×3 IMPLANT
TRAY FOLEY MTR SLVR 16FR STAT (SET/KITS/TRAYS/PACK) ×3 IMPLANT

## 2019-09-10 NOTE — H&P (Signed)
Reviewed paper H+P, will be scanned into chart. No changes noted.  

## 2019-09-10 NOTE — Anesthesia Procedure Notes (Signed)
Spinal  Patient location during procedure: OR Start time: 09/10/2019 9:50 AM End time: 09/10/2019 9:56 AM Staffing Anesthesiologist: Penwarden, Amy, MD Resident/CRNA: Axtell, Scott L, CRNA Performed: resident/CRNA and anesthesiologist  Preanesthetic Checklist Completed: patient identified, site marked, surgical consent, pre-op evaluation, timeout performed, IV checked, risks and benefits discussed and monitors and equipment checked Spinal Block Patient position: sitting Prep: ChloraPrep Patient monitoring: heart rate, continuous pulse ox and blood pressure Approach: midline Location: L4-5 Injection technique: single-shot Needle Needle type: Introducer and Pencil-Tip  Needle gauge: 24 G Needle length: 9 cm Additional Notes Negative paresthesia. Negative blood return. Positive free-flowing CSF. Expiration date of kit checked and confirmed. Patient tolerated procedure well, without complications.       

## 2019-09-10 NOTE — Anesthesia Post-op Follow-up Note (Signed)
Anesthesia QCDR form completed.        

## 2019-09-10 NOTE — Transfer of Care (Signed)
Immediate Anesthesia Transfer of Care Note  Patient: Jamie [name]  Procedure(s) Performed: TOTAL HIP ARTHROPLASTY ANTERIOR APPROACH (Left Hip)  Patient Location: PACU  Anesthesia Type:Spinal  Level of Consciousness: awake and drowsy  Airway & Oxygen Therapy: Patient Spontanous Breathing and Patient connected to nasal cannula oxygen  Post-op Assessment: Report given to RN and Post -op Vital signs reviewed and stable  Post vital signs: Reviewed and stable  Last Vitals:  Vitals Value Taken Time  BP 120/76 09/10/19 1126  Temp 36.2 C 09/10/19 1122  Pulse 58 09/10/19 1128  Resp 12 09/10/19 1128  SpO2 100 % 09/10/19 1128  Vitals shown include unvalidated device data.  Last Pain:  Vitals:   09/10/19 1122  TempSrc:   PainSc: 0-No pain         Complications: No apparent anesthesia complications

## 2019-09-10 NOTE — Op Note (Signed)
09/10/2019  11:20 AM  PATIENT:  Jamie [name]  58 y.o. female  PRE-OPERATIVE DIAGNOSIS:  PRIMARY OSTEOARTHRIITIS OF LEFT HIP  POST-OPERATIVE DIAGNOSIS:  PRIMARY OSTEOARTHRIITIS OF LEFT HIP  PROCEDURE:  Procedure(s): TOTAL HIP ARTHROPLASTY ANTERIOR APPROACH (Left)  SURGEON: Laurene Footman, MD  ASSISTANTS: None  ANESTHESIA:   spinal  EBL:  Total I/O In: -  Out: 300 [Urine:100; Blood:200]  BLOOD ADMINISTERED:none  DRAINS: none   LOCAL MEDICATIONS USED:  MARCAINE    and OTHER Exparel  SPECIMEN:  Source of Specimen:  Left femoral head  DISPOSITION OF SPECIMEN:  PATHOLOGY  COUNTS:  YES  TOURNIQUET:  * No tourniquets in log *  IMPLANTS: Medacta AMIS 1 standard stem, 48 mm mPACT DM cup and liner with ceramic S 28 mm head  DICTATION: .Dragon Dictation   The patient was brought to the operating room and after spinal anesthesia was obtained patient was placed on the operative table with the ipsilateral foot into the Medacta attachment, contralateral leg on a well-padded table. C-arm was brought in and preop template x-ray taken. After prepping and draping in usual sterile fashion appropriate patient identification and timeout procedures were completed. Anterior approach to the hip was obtained and centered over the greater trochanter and TFL muscle. The subcutaneous tissue was incised hemostasis being achieved by electrocautery. TFL fascia was incised and the muscle retracted laterally deep retractor placed. The lateral femoral circumflex vessels were identified and ligated. The anterior capsule was exposed and a capsulotomy performed. The neck was identified and a femoral neck cut carried out with a saw. The head was removed without difficulty and showed sclerotic femoral head and acetabulum. Reaming was carried out to 48 mm and a 48 mm cup trial gave appropriate tightness to the acetabular component a 48 DM cup was impacted into position. The leg was then externally rotated and  ischiofemoral and pubofemoral releases carried out. The femur was sequentially broached to a size 1, size 1 standard stem with S head trials were placed and the final components chosen. The 1 standard stem was inserted along with a ceramic S 28 mm head and 48 mm liner. The hip was reduced and was stable the wound was thoroughly irrigated with fibrillar placed along the posterior capsule and medial neck.  Exparel was injected throughout the case to aid in postop analgesia the deep fascia ws closed using a heavy Quill after infiltration of 30 cc of quarter percent Sensorcaine with epinephrine.3-0 V-loc to close the skin with skin staples.  Incisional wound VAC was applied and patient was sent to recovery in stable condition.   PLAN OF CARE: Admit to inpatient

## 2019-09-10 NOTE — TOC Benefit Eligibility Note (Signed)
Transition of Care [facility]) Benefit Eligibility Note    Patient Details  Name: Jamie [name] MRN: [id number] Date of Birth: February 26, 1961   Medication/Dose: Enoxaparin 55m once daily for 14 days  Covered?: Yes  Tier: 2 Drug  Prescription Coverage Preferred Pharmacy: [facility] Person/Company/Phone Number:: [facility] [employer] at [phone] Co-Pay: $10.00 estimated copay  Prior Approval: No  Deductible: (No deductible.  Individual out of pocket max is $2,700, of which $949.87 met as of time of call.)   HDannette BarbaraPhone Number: [phone] [phone]/20/2020, 9:44 AM

## 2019-09-10 NOTE — TOC Progression Note (Signed)
Transition of Care [facility]) - Progression Note    Patient Details  Name: Jamie [name] MRN: [id number] Date of Birth: [birth year]  Transition of Care [facility] CM/SW Contact  Su Hilt, RN Phone Number: 09/10/2019, 8:57 AM  Clinical Narrative:     Requested the price of Lovenox       Expected Discharge Plan and Services                                                 Social Determinants of Health (SDOH) Interventions    Readmission Risk Interventions No flowsheet data found.

## 2019-09-10 NOTE — Evaluation (Signed)
Physical Therapy Evaluation Patient Details Name: Jamie [name] MRN: [id number] DOB: 15-Jan-1961 Today's Date: 09/10/2019   History of Present Illness  Jamie [name] is a 39yoF who comes to [facility] on 10/20for elevative Left THA, direct anterior approach.  Clinical Impression  Pt admitted with above diagnosis. Pt currently with functional limitations due to the deficits listed below (see "PT Problem List"). Upon entry, pt in bed, awake and agreeable to participate. The pt is alert and oriented x4, pleasant, conversational, and generally a good historian. DTR at bedside throughout. Excellent performance of HEP with minimal intermittent assist only, mostly tactile and visual cuing. No physical assist required for bed mobility, transfers or ambulation, but pt cued heavily for technique and safety measures. Functional mobility assessment demonstrates increased effort/time requirements, poor tolerance, but no need for physical assistance, whereas the patient performed these at a higher level of independence PTA. Will issue HEP handout next date. Pt has no pain limitations. VSS during session. Pt will benefit from skilled PT intervention to increase independence and safety with basic mobility in preparation for discharge to the venue listed below.       Follow Up Recommendations Home health PT;Follow surgeon's recommendation for DC plan and follow-up therapies;Supervision - Intermittent    Equipment Recommendations  Rolling walker with 5" wheels    Recommendations for Other Services       Precautions / Restrictions Precautions Precautions: None;Fall Restrictions Weight Bearing Restrictions: Yes LLE Weight Bearing: Weight bearing as tolerated      Mobility  Bed Mobility Overal bed mobility: Needs Assistance Bed Mobility: Supine to Sit     Supine to sit: Supervision     General bed mobility comments: cues for technique, moderate fffort required  Transfers Overall transfer level: Needs  assistance Equipment used: Rolling walker (2 wheeled) Transfers: Sit to/from Stand Sit to Stand: Supervision         General transfer comment: cues for technique, moderate effort required  Ambulation/Gait Ambulation/Gait assistance: Min guard Gait Distance (Feet): 42 Feet Assistive device: Rolling walker (2 wheeled) Gait Pattern/deviations: Antalgic     General Gait Details: fairly slow adn cautious, reports to feel "fine"  Stairs            Wheelchair Mobility    Modified Rankin (Stroke Patients Only)       Balance Overall balance assessment: Modified Independent;Mild deficits observed, not formally tested                                           Pertinent Vitals/Pain Pain Assessment: No/denies pain(a little sore)    Home Living Family/patient expects to be discharged to:: Private residence Living Arrangements: Spouse/significant other(husband and 2 DTRs) Available Help at Discharge: Family Type of Home: House Home Access: Stairs to enter   Technical brewer of Steps: 1 Home Layout: One level Home Equipment: Cane - single point      Prior Function Level of Independence: Independent         Comments: independent with ADL; AMB limited community distance PTA, works as a sewer sitting, lowts of up and down.     Hand Dominance   Dominant Hand: Right    Extremity/Trunk Assessment   Upper Extremity Assessment Upper Extremity Assessment: Overall WFL for tasks assessed    Lower Extremity Assessment Lower Extremity Assessment: Overall WFL for tasks assessed    Cervical / Trunk Assessment Cervical /  Trunk Assessment: Normal  Communication   Communication: No difficulties  Cognition Arousal/Alertness: Awake/alert Behavior During Therapy: WFL for tasks assessed/performed Overall Cognitive Status: Within Functional Limits for tasks assessed                                        General Comments       Exercises Total Joint Exercises Ankle Circles/Pumps: AROM;20 reps;Supine;Both Quad Sets: AROM;Both;15 reps;Supine Gluteal Sets: AROM;Both;10 reps;Supine Short Arc Quad: AAROM;10 reps;Both;Supine Heel Slides: AAROM;Both;15 reps;Supine Hip ABduction/ADduction: AAROM;Both;15 reps;Supine   Assessment/Plan    PT Assessment Patient needs continued PT services  PT Problem List Decreased strength;Decreased range of motion;Decreased activity tolerance;Decreased balance;Decreased mobility       PT Treatment Interventions DME instruction;Balance training;Gait training;Stair training;Functional mobility training;Therapeutic activities;Therapeutic exercise;Neuromuscular re-education;Patient/family education    PT Goals (Current goals can be found in the Care Plan section)  Acute Rehab PT Goals Patient Stated Goal: Retunr to home with assistance from family PT Goal Formulation: With patient Time For Goal Achievement: 09/24/19 Potential to Achieve Goals: Good    Frequency BID   Barriers to discharge        Co-evaluation               AM-PAC PT "6 Clicks" Mobility  Outcome Measure Help needed turning from your back to your side while in a flat bed without using bedrails?: A Little Help needed moving from lying on your back to sitting on the side of a flat bed without using bedrails?: A Little Help needed moving to and from a bed to a chair (including a wheelchair)?: A Little Help needed standing up from a chair using your arms (e.g., wheelchair or bedside chair)?: A Little Help needed to walk in hospital room?: A Little Help needed climbing 3-5 steps with a railing? : A Little 6 Click Score: 18    End of Session Equipment Utilized During Treatment: Gait belt Activity Tolerance: Patient tolerated treatment well;No increased pain Patient left: in chair;with family/visitor present;with call bell/phone within reach;with SCD's reapplied Nurse Communication: Mobility status PT Visit  Diagnosis: Unsteadiness on feet (R26.81);Other abnormalities of gait and mobility (R26.89);Difficulty in walking, not elsewhere classified (R26.2)    Time: [id number] PT Time Calculation (min) (ACUTE ONLY): 35 min   Charges:   PT Evaluation $PT Eval Low Complexity: 1 Low PT Treatments $Gait Training: 8-22 mins $Therapeutic Exercise: 8-22 mins        4:15 PM, 09/10/19 [name], PT, DPT Physical Therapist - [facility]  [phone] ([place])    [place] C 09/10/2019, 4:13 PM

## 2019-09-10 NOTE — Anesthesia Preprocedure Evaluation (Signed)
Anesthesia Evaluation  Patient identified by MRN, date of birth, ID band Patient awake    Reviewed: Allergy & Precautions, NPO status , Patient's Chart, lab work & pertinent test results  History of Anesthesia Complications Negative for: history of anesthetic complications  Airway Mallampati: II  TM Distance: >3 FB Neck ROM: Full    Dental  (+) Partial Lower, Partial Upper   Pulmonary neg sleep apnea, neg COPD, former smoker,    breath sounds clear to auscultation- rhonchi (-) wheezing      Cardiovascular hypertension, (-) CAD, (-) Past MI, (-) Cardiac Stents and (-) CABG  Rhythm:Regular Rate:Normal - Systolic murmurs and - Diastolic murmurs    Neuro/Psych neg Seizures PSYCHIATRIC DISORDERS Depression negative neurological ROS     GI/Hepatic negative GI ROS, Neg liver ROS,   Endo/Other  diabetes, Oral Hypoglycemic Agents  Renal/GU negative Renal ROS     Musculoskeletal  (+) Arthritis ,   Abdominal (+) - obese,   Peds  Hematology negative hematology ROS (+)   Anesthesia Other Findings Past Medical History: No date: Arthritis No date: Depression No date: Diabetes ([place]) No date: High cholesterol No date: Hypertension No date: Postmenopausal     Comment:  LMP 2012   Reproductive/Obstetrics                             Lab Results  Component Value Date   WBC 4.6 08/28/2019   HGB 11.6 (L) 08/28/2019   HCT 36.1 08/28/2019   MCV 86.4 08/28/2019   PLT 238 08/28/2019    Anesthesia Physical Anesthesia Plan  ASA: II  Anesthesia Plan: Spinal   Post-op Pain Management:    Induction:   PONV Risk Score and Plan: 2 and Propofol infusion  Airway Management Planned: Natural Airway  Additional Equipment:   Intra-op Plan:   Post-operative Plan:   Informed Consent: I have reviewed the patients History and Physical, chart, labs and discussed the procedure including the risks, benefits  and alternatives for the proposed anesthesia with the patient or authorized representative who has indicated his/her understanding and acceptance.     Dental advisory given  Plan Discussed with: CRNA and Anesthesiologist  Anesthesia Plan Comments:         Anesthesia Quick Evaluation

## 2019-09-11 ENCOUNTER — Encounter: Payer: Self-pay | Admitting: Orthopedic Surgery

## 2019-09-11 LAB — GLUCOSE, CAPILLARY
Glucose-Capillary: 101 mg/dL — ABNORMAL HIGH (ref 70–99)
Glucose-Capillary: 99 mg/dL (ref 70–99)
Glucose-Capillary: 134 mg/dL — ABNORMAL HIGH (ref 70–99)
Glucose-Capillary: 119 mg/dL — ABNORMAL HIGH (ref 70–99)

## 2019-09-11 LAB — SURGICAL PATHOLOGY

## 2019-09-11 LAB — HEMOGLOBIN A1C
Hgb A1c MFr Bld: 6 % — ABNORMAL HIGH (ref 4.8–5.6)
Mean Plasma Glucose: 126 mg/dL

## 2019-09-11 MED ORDER — HYDROCODONE-ACETAMINOPHEN 5-325 MG PO TABS: 1.0000 | ORAL_TABLET | ORAL | 0 refills | Status: DC | PRN

## 2019-09-11 MED ORDER — METHOCARBAMOL 500 MG PO TABS: 500.0000 mg | ORAL_TABLET | Freq: Four times a day (QID) | ORAL | 0 refills | Status: DC | PRN

## 2019-09-11 MED ORDER — DOCUSATE SODIUM 100 MG PO CAPS: 100.0000 mg | ORAL_CAPSULE | Freq: Two times a day (BID) | ORAL | 0 refills | Status: DC

## 2019-09-11 MED ORDER — ENOXAPARIN SODIUM 40 MG/0.4ML ~~LOC~~ SOLN: 40.0000 mg | SUBCUTANEOUS | 0 refills | Status: DC

## 2019-09-11 NOTE — TOC Initial Note (Signed)
Transition of Care [facility]) - Initial/Assessment Note    Patient Details  Name: Jamie [name] MRN: [id number] Date of Birth: [birth year]  Transition of Care [facility]) CM/SW Contact:    Su Hilt, RN Phone Number: 09/11/2019, 10:28 AM  Clinical Narrative:                 Met with the patient and discussed DC plan and needs She lives at home with her spouse and 2 adult daughters She needs a RW and a BSC, I notified Brad with [employer] She wants to use [facility] PT, I notified Helene Kelp Her Lovenox is $10 and she can afford her medications, Family provides transportation  Expected Discharge Plan: [place] Barriers to Discharge: Continued Medical Work up   Patient Goals and CMS Choice Patient states their goals for this hospitalization and ongoing recovery are:: go home CMS Medicare.gov Compare Post Acute Care list provided to:: Patient Choice offered to / list presented to : Patient  Expected Discharge Plan and Services Expected Discharge Plan: [place]   Discharge Planning Services: CM Consult Post Acute Care Choice: [place] arrangements for the past 2 months: Single Family Home                 DME Arranged: 3-N-1, Walker rolling DME Agency: [employer] Date DME Agency Contacted: 09/11/19 Time DME Agency Contacted: 5 Representative spoke with at DME Agency: [employer]: PT [place]: [facility] (formerly [employer]) Date [place]: 09/11/19 Time [place]: 52 Representative spoke with at [place]: [place] Arrangements/Services Living arrangements for the past 2 months: [place] Lives with:: Spouse, Adult Children Patient language and need for interpreter reviewed:: Yes Do you feel safe going back to the place where you live?: Yes      Need for Family Participation in Patient Care: No (Comment) Care giver support system in place?: Yes (comment) Current  home services: DME(cane) Criminal Activity/Legal Involvement Pertinent to Current Situation/Hospitalization: No - Comment as needed  Activities of Daily Living Home Assistive Devices/Equipment: Blood pressure cuff, Eyeglasses, Dentures (specify type), Cane (specify quad or straight) ADL Screening (condition at time of admission) Patient's cognitive ability adequate to safely complete daily activities?: Yes Is the patient deaf or have difficulty hearing?: No Does the patient have difficulty seeing, even when wearing glasses/contacts?: No Does the patient have difficulty concentrating, remembering, or making decisions?: No Patient able to express need for assistance with ADLs?: Yes Does the patient have difficulty dressing or bathing?: No Independently performs ADLs?: Yes (appropriate for developmental age) Does the patient have difficulty walking or climbing stairs?: No Weakness of Legs: Left Weakness of Arms/Hands: None  Permission Sought/Granted   Permission granted to share information with : Yes, Verbal Permission Granted              Emotional Assessment Appearance:: Appears stated age Attitude/Demeanor/Rapport: Engaged Affect (typically observed): Appropriate Orientation: : Oriented to Self, Oriented to Place, Oriented to  Time, Oriented to Situation Alcohol / Substance Use: Not Applicable Psych Involvement: No (comment)  Admission diagnosis:  PRIMARY OSTEOARTHRIITIS OF LEFT HIP Patient Active Problem List   Diagnosis Date Noted  . Status post total hip replacement, left 09/10/2019   PCP:  [facility]:   [place], [place] - [address] [place] [address] Phone: [phone] Fax: [phone]  Social Determinants of Health (SDOH) Interventions    Readmission Risk Interventions No flowsheet data found.

## 2019-09-11 NOTE — Progress Notes (Signed)
Physical Therapy Treatment Patient Details Name: Jamie [name] MRN: [id number] DOB: 1961/10/10 Today's Date: 09/11/2019    History of Present Illness Jamie [name] is a 89yoF who comes to [facility] on 10/20for elevative Left THA, direct anterior approach.    PT Comments    Reviewed HEP and safety precautions with patient using handout this date. Pt still only reporting soreness, but no pain in general. Pt doing well with HEP, tactile cues needed but no physical assist. AMB progressed to 256ft. Will trial stairs this afternoon and review HEP in full.     Follow Up Recommendations  Home health PT;Follow surgeon's recommendation for DC plan and follow-up therapies;Supervision - Intermittent     Equipment Recommendations  Rolling walker with 5" wheels    Recommendations for Other Services       Precautions / Restrictions Precautions Precautions: None;Fall Restrictions LLE Weight Bearing: Weight bearing as tolerated    Mobility  Bed Mobility Overal bed mobility: Needs Assistance Bed Mobility: Supine to Sit     Supine to sit: Supervision     General bed mobility comments: cues for technique, moderate effort required  Transfers Overall transfer level: Needs assistance Equipment used: Rolling walker (2 wheeled) Transfers: Sit to/from Stand Sit to Stand: Supervision            Ambulation/Gait Ambulation/Gait assistance: Supervision Gait Distance (Feet): 200 Feet Assistive device: Rolling walker (2 wheeled) Gait Pattern/deviations: WFL(Within Functional Limits);Step-through pattern Gait velocity: 0.70m/s       Stairs             Wheelchair Mobility    Modified Rankin (Stroke Patients Only)       Balance Overall balance assessment: Modified Independent;Mild deficits observed, not formally tested                                          Cognition Arousal/Alertness: Awake/alert Behavior During Therapy: WFL for tasks  assessed/performed Overall Cognitive Status: Within Functional Limits for tasks assessed                                        Exercises Total Joint Exercises Ankle Circles/Pumps: AROM;20 reps;Supine;Both Heel Slides: AAROM;Both;15 reps;Supine Hip ABduction/ADduction: AAROM;Both;15 reps;Supine Long Arc Quad: Left;AROM;10 reps;Seated    General Comments        Pertinent Vitals/Pain Pain Assessment: No/denies pain(sore)    Home Living                      Prior Function            PT Goals (current goals can now be found in the care plan section) Acute Rehab PT Goals Patient Stated Goal: Retunr to home with assistance from family PT Goal Formulation: With patient Time For Goal Achievement: 09/24/19 Potential to Achieve Goals: Good Progress towards PT goals: Progressing toward goals    Frequency    BID      PT Plan      Co-evaluation              AM-PAC PT "6 Clicks" Mobility   Outcome Measure  Help needed turning from your back to your side while in a flat bed without using bedrails?: A Little Help needed moving from lying on your back to sitting on the side of  a flat bed without using bedrails?: A Little Help needed moving to and from a bed to a chair (including a wheelchair)?: A Little Help needed standing up from a chair using your arms (e.g., wheelchair or bedside chair)?: A Little Help needed to walk in hospital room?: A Little Help needed climbing 3-5 steps with a railing? : A Little 6 Click Score: 18    End of Session Equipment Utilized During Treatment: Gait belt Activity Tolerance: Patient tolerated treatment well;No increased pain Patient left: in chair;with family/visitor present;with call bell/phone within reach;with SCD's reapplied Nurse Communication: Mobility status PT Visit Diagnosis: Unsteadiness on feet (R26.81);Other abnormalities of gait and mobility (R26.89);Difficulty in walking, not elsewhere classified  (R26.2)     Time: [id number] PT Time Calculation (min) (ACUTE ONLY): 25 min  Charges:  $Gait Training: 8-22 mins $Therapeutic Exercise: 8-22 mins                     9:43 AM, 09/11/19 [name], PT, DPT Physical Therapist - [facility]  [phone] ([place])    Zhi Geier C 09/11/2019, 9:41 AM

## 2019-09-11 NOTE — Evaluation (Signed)
Occupational Therapy Evaluation Patient Details Name: Jamie [name] MRN: [id number] DOB: 04/24/1961 Today's Date: 09/11/2019    History of Present Illness Jamie [name] is a 61yoF who [name] to [facility] on 10/20 for elevative Left THA, direct anterior approach.   Clinical Impression   Pt seen for OT evaluation this date, POD#1 from above surgery. Pt was independent in all ADLs and IADLs prior to surgery, however occasionally using SPC for fxl mobility d/t L hip pain. Pt is eager to return to PLOF with less pain and improved safety and independence. Pt currently requires MIN assist for LB dressing with AE while in seated position due to pain and limited AROM of L hip. Pt instructed in L LE WBAT precaution and how to implement, self care skills, falls prevention strategies, home/routines modifications, DME/AE for LB bathing and dressing tasks, compression stocking mgt strategies, and ADL transfer techniques. Pt would benefit from additional instruction in self care skills and techniques to help maintain precautions with or without assistive devices to support recall and carryover prior to discharge. Recommend HHOT upon discharge.      Follow Up Recommendations  Home health OT    Equipment Recommendations  3 in 1 bedside commode    Recommendations for Other Services       Precautions / Restrictions Precautions Precautions: None;Fall Restrictions Weight Bearing Restrictions: Yes LLE Weight Bearing: Weight bearing as tolerated      Mobility Bed Mobility     General bed mobility comments: deferred, pt sitting up in chair when OT presents.  Transfers Overall transfer level: Needs assistance Equipment used: Rolling walker (2 wheeled) Transfers: Sit to/from Stand Sit to Stand: Supervision         General transfer comment: MIN verbal cues for safe hand/foot placement.    Balance Overall balance assessment: Modified Independent;Mild deficits observed, not formally tested   Sitting  balance-Leahy Scale: Normal     Standing balance support: Bilateral upper extremity supported Standing balance-Leahy Scale: Fair Standing balance comment: G static standing balance, F dynamic                           ADL either performed or assessed with clinical judgement   ADL Overall ADL's : Needs assistance/impaired Eating/Feeding: Independent;Sitting   Grooming: Wash/dry hands;Wash/dry face;Supervision/safety;Standing   Upper Body Bathing: Sitting;Independent   Lower Body Bathing: Supervison/ safety;Min guard;Sit to/from stand   Upper Body Dressing : Independent;Sitting   Lower Body Dressing: Minimal assistance;Sit to/from stand;With adaptive equipment   Toilet Transfer: Supervision/safety;Grab bars;RW   Toileting- Water quality scientist and Hygiene: Supervision/safety;Minimal assistance;Sit to/from stand   Tub/ Shower Transfer: Min guard;Grab Engineer, agricultural Details (indicate cue type and reason): based on clinical observation Functional mobility during ADLs: Supervision/safety;Rolling walker(3-4 steps fwd/bkwd with FWW with MIN verbal cues for weight shift.)       Vision Patient Visual Report: No change from baseline       Perception     Praxis      Pertinent Vitals/Pain Pain Assessment: 0-10 Pain Score: 7  Pain Location: L hip with fxl mobility Pain Descriptors / Indicators: Aching;Sore Pain Intervention(s): Limited activity within patient's tolerance;Monitored during session;Premedicated before session     Hand Dominance Right   Extremity/Trunk Assessment Upper Extremity Assessment Upper Extremity Assessment: Overall WFL for tasks assessed;RUE deficits/detail;LUE deficits/detail RUE Deficits / Details: grossly >3/5 LUE Deficits / Details: grossly >3/5   Lower Extremity Assessment Lower Extremity Assessment: Defer to PT evaluation;Overall [facility] for  tasks assessed   Cervical / Trunk Assessment Cervical / Trunk  Assessment: Normal   Communication Communication Communication: No difficulties   Cognition Arousal/Alertness: Awake/alert Behavior During Therapy: WFL for tasks assessed/performed Overall Cognitive Status: Within Functional Limits for tasks assessed                                     General Comments       Exercises Other Exercises Other Exercises: OT facilitates education re: AE use for LB ADLs d/t pain and decreased L hip ROM. Demo provided, pt verbalized understanding. F/u for return demo from pt would be beneficial. Other Exercises: OT facilitates education re: general fall prevention and safety: scanning environment, safe use of FWW, weight shifting. Pt demos understanding. Other Exercises: OT facilitates education re: family assistance tips for donning/doffing compression stockings and compression stocking mgt. Pt verbalized understanding.   Shoulder Instructions      Home Living Family/patient expects to be discharged to:: Private residence Living Arrangements: Spouse/significant other;Children(2 daughters able to help.) Available Help at Discharge: Family Type of Home: House Home Access: Stairs to enter Technical brewer of Steps: 1   Home Layout: One level     Bathroom Shower/Tub: Tub/shower unit         Home Equipment: Cane - single point          Prior Functioning/Environment Level of Independence: Independent        Comments: Pt was able to perform all aspects of self care and IADLs including driving. 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        OT Problem List: Decreased range of motion;Decreased activity tolerance;Impaired balance (sitting and/or standing);Decreased knowledge of use of DME or AE;Pain      OT Treatment/Interventions: Self-care/ADL training;Therapeutic exercise;Energy conservation;DME and/or AE instruction;Therapeutic activities;Patient/family education    OT  Goals(Current goals can be found in the care plan section) Acute Rehab OT Goals Patient Stated Goal: to get home to her family OT Goal Formulation: With patient Time For Goal Achievement: 09/25/19 Potential to Achieve Goals: Good  OT Frequency: Min 1X/week   Barriers to D/C:            Co-evaluation              AM-PAC OT "6 Clicks" Daily Activity     Outcome Measure Help from another person eating meals?: None Help from another person taking care of personal grooming?: A Little Help from another person toileting, which includes using toliet, bedpan, or urinal?: A Little Help from another person bathing (including washing, rinsing, drying)?: A Little Help from another person to put on and taking off regular upper body clothing?: None Help from another person to put on and taking off regular lower body clothing?: A Little 6 Click Score: 20   End of Session Equipment Utilized During Treatment: Gait belt;Rolling walker  Activity Tolerance: Patient tolerated treatment well Patient left: in chair;with call bell/phone within reach  OT Visit Diagnosis: Unsteadiness on feet (R26.81);Muscle weakness (generalized) (M62.81);Pain Pain - Right/Left: Left Pain - part of body: Hip                Time: [id number] OT Time Calculation (min): 32 min Charges:  OT General Charges $OT Visit: 1 Visit OT Evaluation $OT Eval Low Complexity: 1 Low OT Treatments $Self Care/Home Management : 8-22 mins $Therapeutic Activity: 8-22 mins  Gerrianne Scale, MS, OTR/L ascom [phone] or [phone] 09/11/19, 10:51 AM

## 2019-09-11 NOTE — Discharge Summary (Addendum)
Physician Discharge Summary  Patient ID: Jamie [name] MRN: [id number] DOB/AGE: [birth year] 58 y.o.  Admit date: 09/10/2019 Discharge date: 09/12/2019 Admission Diagnoses:  PRIMARY OSTEOARTHRIITIS OF LEFT HIP   Discharge Diagnoses: Patient Active Problem List   Diagnosis Date Noted  . Status post total hip replacement, left 09/10/2019    Past Medical History:  Diagnosis Date  . Arthritis   . Depression   . Diabetes ([place])   . High cholesterol   . Hypertension   . Postmenopausal    LMP 2012     Transfusion: none   Consultants (if any):   Discharged Condition: Improved  Hospital Course: Jamie [name] is an 58 y.o. female who was admitted 09/10/2019 with a diagnosis of left hip osteoarthritis and went to the operating room on 09/10/2019 and underwent the above named procedures.    Surgeries: Procedure(s): TOTAL HIP ARTHROPLASTY ANTERIOR APPROACH on 09/10/2019 Patient tolerated the surgery well. Taken to PACU where she was stabilized and then transferred to the orthopedic floor.  Started on Lovenox 40 mg q 24 hrs. Foot pumps applied bilaterally at 80 mm. Heels elevated on bed with rolled towels. No evidence of DVT. Negative [place]. Physical therapy started on day #1 for gait training and transfer. OT started day #1 for ADL and assisted devices.  Patient's foley was d/c on day #1. Patient's IV was d/c on day #1.  On post op day #2 patient was stable and ready for discharge to home with home health PT.  Implants:  Medacta AMIS 1 standard stem, 48 mm mPACT DM cup and liner with ceramic S 28 mm head  She was given perioperative antibiotics:  Anti-infectives (From admission, onward)   Start     Dose/Rate Route Frequency Ordered Stop   09/10/19 2200  vancomycin (VANCOCIN) IVPB 1000 mg/200 mL premix     1,000 mg 200 mL/hr over 60 Minutes Intravenous Every 12 hours 09/10/19 1327 09/10/19 2222   09/10/19 0843  vancomycin (VANCOCIN) 1-5 GM/200ML-% IVPB    Note to Pharmacy:  Calton Dach   : cabinet override      09/10/19 0843 09/10/19 1002   09/10/19 0400  vancomycin (VANCOCIN) IVPB 1000 mg/200 mL premix     1,000 mg 200 mL/hr over 60 Minutes Intravenous  Once 09/10/19 0359 09/10/19 1102    .  She was given sequential compression devices, early ambulation, and Lovenox, teds for DVT prophylaxis.  She benefited maximally from the hospital stay and there were no complications.    Recent vital signs:  Vitals:   09/11/19 0419 09/11/19 0800  BP: 105/72 106/70  Pulse: 82 75  Resp: 17 16  Temp: 98.3 F (36.8 C) 98.2 F (36.8 C)  SpO2: 100% 100%    Recent laboratory studies:  Lab Results  Component Value Date   HGB 10.5 (L) 09/10/2019   HGB 11.6 (L) 08/28/2019   Lab Results  Component Value Date   WBC 12.5 (H) 09/10/2019   PLT 192 09/10/2019   Lab Results  Component Value Date   INR 0.9 08/28/2019   Lab Results  Component Value Date   NA 139 08/28/2019   K 4.2 08/28/2019   CL 101 08/28/2019   CO2 29 08/28/2019   BUN 20 08/28/2019   CREATININE 0.59 09/10/2019   GLUCOSE 82 08/28/2019    Discharge Medications:   Allergies as of 09/11/2019      Reactions   Clindamycin/lincomycin Rash   Penicillin G Rash   Did it involve swelling of  the face/tongue/throat, SOB, or low BP? No Did it involve sudden or severe rash/hives, skin peeling, or any reaction on the inside of your mouth or nose? Yes Did you need to seek medical attention at a hospital or doctor's office? Yes When did it last happen?when she was younger If all above answers are "NO", may proceed with cephalosporin use.      Medication List    STOP taking these medications   diclofenac 75 MG EC tablet Commonly known as: VOLTAREN     TAKE these medications   atorvastatin 40 MG tablet Commonly known as: LIPITOR Take 40 mg by mouth daily. Evening   cholecalciferol 25 MCG (1000 UT) tablet Commonly known as: VITAMIN D3 Take 1,000 Units by mouth daily.    clindamycin 300 MG capsule Commonly known as: CLEOCIN Take 1 capsule (300 mg total) by mouth 3 (three) times daily.   docusate sodium 100 MG capsule Commonly known as: COLACE Take 1 capsule (100 mg total) by mouth 2 (two) times daily.   enoxaparin 40 MG/0.4ML injection Commonly known as: LOVENOX Inject 0.4 mLs (40 mg total) into the skin daily.   HYDROcodone-acetaminophen 5-325 MG tablet Commonly known as: NORCO/VICODIN Take 1-2 tablets by mouth every 4 (four) hours as needed for moderate pain (pain score 4-6).   lisinopril 10 MG tablet Commonly known as: ZESTRIL Take 10 mg by mouth daily.   metFORMIN 1000 MG tablet Commonly known as: GLUCOPHAGE Take 1,000 mg by mouth daily with breakfast.   methocarbamol 500 MG tablet Commonly known as: ROBAXIN Take 1 tablet (500 mg total) by mouth every 6 (six) hours as needed for muscle spasms.   PARoxetine 10 MG tablet Commonly known as: PAXIL Take 10 mg by mouth daily.   Voltaren 1 % Gel Generic drug: diclofenac sodium Apply 2 g topically 2 (two) times daily as needed.            Durable Medical Equipment  (From admission, onward)         Start     Ordered   09/10/19 1327  DME Walker rolling  Once    Question:  Patient needs a walker to treat with the following condition  Answer:  Status post total hip replacement, left   09/10/19 1327   09/10/19 1327  DME Bedside commode  Once    Question:  Patient needs a bedside commode to treat with the following condition  Answer:  Status post total hip replacement, left   09/10/19 1327   09/10/19 1327  DME 3 n 1  Once     09/10/19 1327          Diagnostic Studies: Dg Hip Operative Unilat W Or W/o Pelvis Left  Result Date: 09/10/2019 CLINICAL DATA:  Intraoperative imaging for left hip replacement. EXAM: OPERATIVE LEFT HIP (WITH PELVIS IF PERFORMED) 2 VIEWS TECHNIQUE: Fluoroscopic spot image(s) were submitted for interpretation post-operatively. COMPARISON:  None. FINDINGS:  Provided images demonstrate a left total hip arthroplasty in place. The device is located. No fracture or other acute abnormality. IMPRESSION: Intraoperative imaging for left hip replacement.  No acute finding. Electronically Signed   By: Inge Rise M.D.   On: 09/10/2019 11:17   Dg Hip Unilat W Or W/o Pelvis 2-3 Views Left  Result Date: 09/10/2019 CLINICAL DATA:  Postop left hip EXAM: DG HIP (WITH OR WITHOUT PELVIS) 2-3V LEFT COMPARISON:  None FINDINGS: Changes of left hip replacement. Normal alignment. No hardware bony complicating feature. IMPRESSION: Left hip replacement.  No  visible complicating feature. Electronically Signed   By: Rolm Baptise M.D.   On: 09/10/2019 11:57    Disposition:     Follow-up Information    Duanne Guess, PA-C Follow up in 2 week(s).   Specialties: Orthopedic Surgery, Emergency Medicine Contact information: [place] [address] [phone]            Signed: Feliberto Gottron 09/11/2019, 8:10 AM

## 2019-09-11 NOTE — Progress Notes (Signed)
   Subjective: 1 Day Post-Op Procedure(s) (LRB): TOTAL HIP ARTHROPLASTY ANTERIOR APPROACH (Left) Patient reports pain as mild.   Patient is well, and has had no acute complaints or problems Denies any CP, SOB, ABD pain. We will continue therapy today.  Plan is to go Home after hospital stay.  Objective: Vital signs in last 24 hours: Temp:  [97 F (36.1 C)-98.3 F (36.8 C)] 98.2 F (36.8 C) (10/21 0800) Pulse Rate:  [46-99] 75 (10/21 0800) Resp:  [12-18] 16 (10/21 0800) BP: (67-138)/(48-85) 106/70 (10/21 0800) SpO2:  [98 %-100 %] 100 % (10/21 0800) Weight:  [81.2 kg] 81.2 kg (10/20 0816)  Intake/Output from previous day: 10/20 0701 - 10/21 0700 In: 1330.7 [P.O.:240; I.V.:1090.7] Out: 1000 [Urine:300; Drains:500; Blood:200] Intake/Output this shift: No intake/output data recorded.  Recent Labs    09/10/19 1437  HGB 10.5*   Recent Labs    09/10/19 1437  WBC 12.5*  RBC 3.80*  HCT 33.5*  PLT 192   Recent Labs    09/10/19 1437  CREATININE 0.59   No results for input(s): LABPT, INR in the last 72 hours.  EXAM General - Patient is Alert, Appropriate and Oriented Extremity - Sensation intact distally Intact pulses distally Dorsiflexion/Plantar flexion intact No cellulitis present Compartment soft Dressing - dressing C/D/I and no drainage, Praveena intact without drainage. Motor Function - intact, moving foot and toes well on exam.   Past Medical History:  Diagnosis Date  . Arthritis   . Depression   . Diabetes ([place])   . High cholesterol   . Hypertension   . Postmenopausal    LMP 2012    Assessment/Plan:   1 Day Post-Op Procedure(s) (LRB): TOTAL HIP ARTHROPLASTY ANTERIOR APPROACH (Left) Active Problems:   Status post total hip replacement, left  Estimated body mass index is 28.89 kg/m as calculated from the following:   Height as of this encounter: 5\' 6"  (1.676 m).   Weight as of this encounter: 81.2 kg. Advance diet Up with therapy  Work on  bowel movement Labs and vital signs are stable Pain well controlled. Good progress with physical therapy yesterday, if completion of PT goals today and patient is safe enough to return home, possible discharge to home with home health PT today.   DVT Prophylaxis - Lovenox, TED hose and SCDs Weight-Bearing as tolerated to left leg   T. Rachelle Hora, PA-C [place] 09/11/2019, 8:06 AM

## 2019-09-11 NOTE — Progress Notes (Signed)
Physical Therapy Treatment Patient Details Name: Jamie [name] MRN: [id number] DOB: 20-Oct-1961 Today's Date: 09/11/2019    History of Present Illness Jamie [name] is a 75yoF who comes [facility] on 10/20for elevative Left THA, direct anterior approach.    PT Comments    Pt progressing well with >100% increase in both AMB distance and gait speed. Reviewed supine HEP infull this afternoon. Pt has most pain and difficulty with Hip ABDCT/ADD and SLR. ModA provided for performance of SLR. Pt agrees to attempt independently this evening. Pt able to perform 2 steps as well. All acute PT goals met at this time.      Follow Up Recommendations  Home health PT;Follow surgeon's recommendation for DC plan and follow-up therapies;Supervision - Intermittent     Equipment Recommendations  Rolling walker with 5" wheels    Recommendations for Other Services       Precautions / Restrictions Precautions Precautions: None;Fall Restrictions Weight Bearing Restrictions: Yes LLE Weight Bearing: Weight bearing as tolerated    Mobility  Bed Mobility Overal bed mobility: Modified Independent       Supine to sit: Modified independent (Device/Increase time)     General bed mobility comments: deferred, pt sitting up in chair when OT presents.  Transfers Overall transfer level: Needs assistance Equipment used: Rolling walker (2 wheeled) Transfers: Sit to/from Stand Sit to Stand: Supervision         General transfer comment: MIN verbal cues for safe hand/foot placement.  Ambulation/Gait Ambulation/Gait assistance: Supervision Gait Distance (Feet): 320 Feet Assistive device: Rolling walker (2 wheeled) Gait Pattern/deviations: WFL(Within Functional Limits);Step-through pattern Gait velocity: 0.57ms afternoon; 0.264m in AM session       Stairs Stairs: Yes Stairs assistance: Min guard Stair Management: Two rails;Step to pattern Number of Stairs: 2     Wheelchair Mobility     Modified Rankin (Stroke Patients Only)       Balance Overall balance assessment: Modified Independent   Sitting balance-Leahy Scale: Normal     Standing balance support: Bilateral upper extremity supported Standing balance-Leahy Scale: Fair Standing balance comment: G static standing balance, F dynamic                            Cognition Arousal/Alertness: Awake/alert Behavior During Therapy: WFL for tasks assessed/performed Overall Cognitive Status: Within Functional Limits for tasks assessed                                        Exercises Bilat Glute set 10x3secH Left Quad set 10x3secH  LLE heel slides 10x LLE hip ABDCT/ADD 10x LLE SAQ 1x10x3secH LLE SLR 1x10 (modA)  B pillow squeeze 1x10x3secH         Prior Function    PT Goals (current goals can now be found in the care plan section) Acute Rehab PT Goals Patient Stated Goal: to get home to her family PT Goal Formulation: With patient Time For Goal Achievement: 09/24/19 Potential to Achieve Goals: Good Progress towards PT goals: Progressing toward goals    Frequency    BID      PT Plan Current plan remains appropriate    Co-evaluation              AM-PAC PT "6 Clicks" Mobility   Outcome Measure  Help needed turning from your back to your side while in a flat bed without  using bedrails?: A Little Help needed moving from lying on your back to sitting on the side of a flat bed without using bedrails?: A Little Help needed moving to and from a bed to a chair (including a wheelchair)?: A Little Help needed standing up from a chair using your arms (e.g., wheelchair or bedside chair)?: A Little Help needed to walk in hospital room?: A Little Help needed climbing 3-5 steps with a railing? : A Little 6 Click Score: 18    End of Session Equipment Utilized During Treatment: Gait belt Activity Tolerance: Patient tolerated treatment well;No increased pain Patient left:  with family/visitor present;with call bell/phone within reach;with SCD's reapplied;in bed Nurse Communication: Mobility status PT Visit Diagnosis: Unsteadiness on feet (R26.81);Other abnormalities of gait and mobility (R26.89);Difficulty in walking, not elsewhere classified (R26.2)     Time: 5053-9767 PT Time Calculation (min) (ACUTE ONLY): 30 min  Charges:  $Gait Training: 8-22 mins $Therapeutic Exercise: 8-22 mins                1:54 PM, 09/11/19 [name], PT, DPT Physical Therapist - [facility]  [phone] ([place])     Juleah Paradise C 09/11/2019, 1:52 PM

## 2019-09-11 NOTE — Discharge Instructions (Signed)
ANTERIOR APPROACH TOTAL HIP REPLACEMENT POSTOPERATIVE DIRECTIONS   Hip Rehabilitation, Guidelines Following Surgery  The results of a hip operation are greatly improved after range of motion and muscle strengthening exercises. Follow all safety measures which are given to protect your hip. If any of these exercises cause increased pain or swelling in your joint, decrease the amount until you are comfortable again. Then slowly increase the exercises. Call your caregiver if you have problems or questions.   HOME CARE INSTRUCTIONS  Remove items at home which could result in a fall. This includes throw rugs or furniture in walking pathways.   ICE to the affected hip every three hours for 30 minutes at a time and then as needed for pain and swelling.  Continue to use ice on the hip for pain and swelling from surgery. You may notice swelling that will progress down to the foot and ankle.  This is normal after surgery.  Elevate the leg when you are not up walking on it.    Continue to use the breathing machine which will help keep your temperature down.  It is common for your temperature to cycle up and down following surgery, especially at night when you are not up moving around and exerting yourself.  The breathing machine keeps your lungs expanded and your temperature down.  Do not place pillow under knee, focus on keeping the knee straight while resting  DIET You may resume your previous home diet once your are discharged from the hospital.  DRESSING / WOUND CARE / SHOWERING Please remove provena negative pressure dressing on 09/18/2019 and apply honey comb dressing. Keep dressing clean and dry at all times.  ACTIVITY Walk with your walker as instructed. Use walker as long as suggested by your caregivers. Avoid periods of inactivity such as sitting longer than an hour when not asleep. This helps prevent blood clots.  You may resume a sexual relationship in one month or when given the OK by  your doctor.  You may return to work once you are cleared by your doctor.  Do not drive a car for 6 weeks or until released by you surgeon.  Do not drive while taking narcotics.  WEIGHT BEARING Weight bearing as tolerated. Use walker/cane as needed for at least 4 weeks post op.  POSTOPERATIVE CONSTIPATION PROTOCOL Constipation - defined medically as fewer than three stools per week and severe constipation as less than one stool per week.  One of the most common issues patients have following surgery is constipation.  Even if you have a regular bowel pattern at home, your normal regimen is likely to be disrupted due to multiple reasons following surgery.  Combination of anesthesia, postoperative narcotics, change in appetite and fluid intake all can affect your bowels.  In order to avoid complications following surgery, here are some recommendations in order to help you during your recovery period.  Colace (docusate) - Pick up an over-the-counter form of Colace or another stool softener and take twice a day as long as you are requiring postoperative pain medications.  Take with a full glass of water daily.  If you experience loose stools or diarrhea, hold the colace until you stool forms back up.  If your symptoms do not get better within 1 week or if they get worse, check with your doctor.  Dulcolax (bisacodyl) - Pick up over-the-counter and take as directed by the product packaging as needed to assist with the movement of your bowels.  Take with a full  glass of water.  Use this product as needed if not relieved by Colace only.  ° °MiraLax (polyethylene glycol) - Pick up over-the-counter to have on hand.  MiraLax is a solution that will increase the amount of water in your bowels to assist with bowel movements.  Take as directed and can mix with a glass of water, juice, soda, coffee, or tea.  Take if you go more than two days without a movement. °Do not use MiraLax more than once per day. Call your  doctor if you are still constipated or irregular after using this medication for 7 days in a row. ° °If you continue to have problems with postoperative constipation, please contact the office for further assistance and recommendations.  If you experience "the worst abdominal pain ever" or develop nausea or vomiting, please contact the office immediatly for further recommendations for treatment. ° °ITCHING ° If you experience itching with your medications, try taking only a single pain pill, or even half a pain pill at a time.  You can also use Benadryl over the counter for itching or also to help with sleep.  ° °TED HOSE STOCKINGS °Wear the elastic stockings on both legs for six weeks following surgery during the day but you may remove then at night for sleeping. ° °MEDICATIONS °See your medication summary on the “After Visit Summary” that the nursing staff will review with you prior to discharge.  You may have some home medications which will be placed on hold until you complete the course of blood thinner medication.  It is important for you to complete the blood thinner medication as prescribed by your surgeon.  Continue your approved medications as instructed at time of discharge. ° °PRECAUTIONS °If you experience chest pain or shortness of breath - call 911 immediately for transfer to the hospital emergency department.  °If you develop a fever greater that 101 F, purulent drainage from wound, increased redness or drainage from wound, foul odor from the wound/dressing, or calf pain - CONTACT YOUR SURGEON.   °                                                °FOLLOW-UP APPOINTMENTS °Make sure you keep all of your appointments after your operation with your surgeon and caregivers. You should call the office at the above phone number and make an appointment for approximately two weeks after the date of your surgery or on the date instructed by your surgeon outlined in the "After Visit Summary". ° °RANGE OF MOTION  AND STRENGTHENING EXERCISES  °These exercises are designed to help you keep full movement of your hip joint. Follow your caregiver's or physical therapist's instructions. Perform all exercises about fifteen times, three times per day or as directed. Exercise both hips, even if you have had only one joint replacement. These exercises can be done on a training (exercise) mat, on the floor, on a table or on a bed. Use whatever works the best and is most comfortable for you. Use music or television while you are exercising so that the exercises are a pleasant break in your day. This will make your life better with the exercises acting as a break in routine you can look forward to.  °Lying on your back, slowly slide your foot toward your buttocks, raising your knee up off the floor. Then slowly   slide your foot back down until your leg is straight again.  Lying on your back spread your legs as far apart as you can without causing discomfort.  Lying on your side, raise your upper leg and foot straight up from the floor as far as is comfortable. Slowly lower the leg and repeat.  Lying on your back, tighten up the muscle in the front of your thigh (quadriceps muscles). You can do this by keeping your leg straight and trying to raise your heel off the floor. This helps strengthen the largest muscle supporting your knee.  Lying on your back, tighten up the muscles of your buttocks both with the legs straight and with the knee bent at a comfortable angle while keeping your heel on the floor.   IF YOU ARE TRANSFERRED TO A SKILLED REHAB FACILITY If the patient is transferred to a skilled rehab facility following release from the hospital, a list of the current medications will be sent to the facility for the patient to continue.  When discharged from the skilled rehab facility, please have the facility set up the patient's [place] prior to being released. Also, the skilled facility will be responsible  for providing the patient with their medications at time of release from the facility to include their pain medication, the muscle relaxants, and their blood thinner medication. If the patient is still at the rehab facility at time of the two week follow up appointment, the skilled rehab facility will also need to assist the patient in arranging follow up appointment in our office and any transportation needs.  MAKE SURE YOU:  Understand these instructions.  Get help right away if you are not doing well or get worse.    Pick up stool softner and laxative for home use following surgery while on pain medications. Continue to use ice for pain and swelling after surgery. Do not use any lotions or creams on the incision until instructed by your surgeon.

## 2019-09-11 NOTE — Anesthesia Postprocedure Evaluation (Signed)
Anesthesia Post Note  Patient: Jamie [name]  Procedure(s) Performed: TOTAL HIP ARTHROPLASTY ANTERIOR APPROACH (Left Hip)  Patient location during evaluation: Nursing Unit Anesthesia Type: Spinal Level of consciousness: oriented and awake and alert Pain management: pain level controlled Vital Signs Assessment: post-procedure vital signs reviewed and stable Respiratory status: spontaneous breathing and respiratory function stable Cardiovascular status: blood pressure returned to baseline and stable Postop Assessment: no headache, no backache, no apparent nausea or vomiting and patient able to bend at knees Anesthetic complications: no     Last Vitals:  Vitals:   09/11/19 0419 09/11/19 0800  BP: 105/72 106/70  Pulse: 82 75  Resp: 17 16  Temp: 36.8 C 36.8 C  SpO2: 100% 100%    Last Pain:  Vitals:   09/10/19 2119  TempSrc:   PainSc: 0-No pain                 Caryl Asp

## 2019-09-12 LAB — GLUCOSE, CAPILLARY
Glucose-Capillary: 131 mg/dL — ABNORMAL HIGH (ref 70–99)
Glucose-Capillary: 114 mg/dL — ABNORMAL HIGH (ref 70–99)

## 2019-09-12 NOTE — Progress Notes (Signed)
Pt. Discharged to home via family vehicle. Discharge instructions and medication regimen reviewed at bedside with patient. Pt. verbalizes understanding of instructions and medication regimen. Prescriptions sent home with pt. Patient assessment unchanged from this morning. IV discontinued per policy. Pt dressed and wheeled to visitors entrance and assisted into vehicle.

## 2019-09-12 NOTE — Progress Notes (Signed)
Physical Therapy Treatment Patient Details Name: Jamie [name] MRN: [id number] DOB: [birth year] Today's Date: 09/12/2019    History of Present Illness Jamie [name] is a 77yoF who comes to [facility] on 10/20for elevative Left THA, direct anterior approach.    PT Comments    Participated in exercises as described below.  Out of bed with increased time but no assist.  Ambulated x 1 lap around nursing unit with RW and min guard/supervision.  No LOB or buckling but decreased gait speed which increased with time.  Discussed home safety and expectations of recovery.  No further questions.  Awaiting d/c after BM.   Follow Up Recommendations  Home health PT;Follow surgeon's recommendation for DC plan and follow-up therapies;Supervision - Intermittent     Equipment Recommendations  Rolling walker with 5" wheels    Recommendations for Other Services       Precautions / Restrictions Precautions Precautions: None;Fall Restrictions Weight Bearing Restrictions: Yes LLE Weight Bearing: Weight bearing as tolerated    Mobility  Bed Mobility Overal bed mobility: Modified Independent Bed Mobility: Supine to Sit     Supine to sit: Modified independent (Device/Increase time)        Transfers Overall transfer level: Needs assistance Equipment used: Rolling walker (2 wheeled) Transfers: Sit to/from Stand Sit to Stand: Min guard            Ambulation/Gait Ambulation/Gait assistance: Min guard Gait Distance (Feet): 200 Feet Assistive device: Rolling walker (2 wheeled) Gait Pattern/deviations: Step-through pattern;Decreased step length - right;Decreased step length - left     General Gait Details: fairly slow and cautious,   Stairs         General stair comments: reported feeling comfortable and declined trial again today   Wheelchair Mobility    Modified Rankin (Stroke Patients Only)       Balance Overall balance assessment: Modified Independent                                           Cognition Arousal/Alertness: Awake/alert Behavior During Therapy: WFL for tasks assessed/performed Overall Cognitive Status: Within Functional Limits for tasks assessed                                        Exercises Total Joint Exercises Ankle Circles/Pumps: AROM;20 reps;Supine;Both Heel Slides: AAROM;Both;15 reps;Supine Hip ABduction/ADduction: AAROM;Both;15 reps;Supine Long Arc Quad: Left;AROM;10 reps;Seated    General Comments        Pertinent Vitals/Pain Pain Assessment: Faces Faces Pain Scale: Hurts little more Pain Location: L hip with fxl mobility Pain Descriptors / Indicators: Sore Pain Intervention(s): Limited activity within patient's tolerance;Repositioned;Patient requesting pain meds-RN notified    Home Living                      Prior Function            PT Goals (current goals can now be found in the care plan section) Progress towards PT goals: Progressing toward goals    Frequency    BID      PT Plan Current plan remains appropriate    Co-evaluation              AM-PAC PT "6 Clicks" Mobility   Outcome Measure  Help needed turning from your back to  your side while in a flat bed without using bedrails?: None Help needed moving from lying on your back to sitting on the side of a flat bed without using bedrails?: A Little Help needed moving to and from a bed to a chair (including a wheelchair)?: A Little Help needed standing up from a chair using your arms (e.g., wheelchair or bedside chair)?: A Little Help needed to walk in hospital room?: A Little Help needed climbing 3-5 steps with a railing? : A Little 6 Click Score: 19    End of Session Equipment Utilized During Treatment: Gait belt Activity Tolerance: Patient tolerated treatment well Patient left: in chair;with call bell/phone within reach;with chair alarm set Nurse Communication: Mobility status;Patient requests  pain meds       Time: [id number] PT Time Calculation (min) (ACUTE ONLY): 23 min  Charges:  $Gait Training: 8-22 mins $Therapeutic Exercise: 8-22 mins                     Chesley Noon, PTA 09/12/19, 9:40 AM

## 2019-09-12 NOTE — Progress Notes (Signed)
   Subjective: 2 Days Post-Op Procedure(s) (LRB): TOTAL HIP ARTHROPLASTY ANTERIOR APPROACH (Left) Patient reports pain as mild.   Patient is well, and has had no acute complaints or problems Denies any CP, SOB, ABD pain. We will continue therapy today.  Plan is to go Home after hospital stay.  Objective: Vital signs in last 24 hours: Temp:  [98.3 F (36.8 C)-99.1 F (37.3 C)] 98.9 F (37.2 C) (10/22 0748) Pulse Rate:  [89-99] 99 (10/22 0748) Resp:  [18-19] 19 (10/21 2242) BP: (119-136)/(74-77) 129/74 (10/22 0748) SpO2:  [97 %-100 %] 97 % (10/22 0748)  Intake/Output from previous day: 10/21 0701 - 10/22 0700 In: 560 [P.O.:560] Out: 600 [Urine:600] Intake/Output this shift: No intake/output data recorded.  Recent Labs    09/10/19 1437  HGB 10.5*   Recent Labs    09/10/19 1437  WBC 12.5*  RBC 3.80*  HCT 33.5*  PLT 192   Recent Labs    09/10/19 1437  CREATININE 0.59   No results for input(s): LABPT, INR in the last 72 hours.  EXAM General - Patient is Alert, Appropriate and Oriented Extremity - Sensation intact distally Intact pulses distally Dorsiflexion/Plantar flexion intact No cellulitis present Compartment soft Dressing - dressing C/D/I and no drainage, Praveena intact without drainage. Motor Function - intact, moving foot and toes well on exam.   Past Medical History:  Diagnosis Date  . Arthritis   . Depression   . Diabetes ([place])   . High cholesterol   . Hypertension   . Postmenopausal    LMP 2012    Assessment/Plan:   2 Days Post-Op Procedure(s) (LRB): TOTAL HIP ARTHROPLASTY ANTERIOR APPROACH (Left) Active Problems:   Status post total hip replacement, left  Estimated body mass index is 28.89 kg/m as calculated from the following:   Height as of this encounter: 5\' 6"  (1.676 m).   Weight as of this encounter: 81.2 kg. Advance diet Up with therapy  Discharge home with home health PT today pending bowel movement.   DVT Prophylaxis -  Lovenox, TED hose and SCDs Weight-Bearing as tolerated to left leg   T. Rachelle Hora, PA-C [place] 09/12/2019, 8:23 AM

## 2019-09-12 NOTE — TOC Transition Note (Signed)
Transition of Care [facility]) - CM/SW Discharge Note   Patient Details  Name: CHARMELL ARAIZA MRN: [id number] Date of Birth: 11/25/1960  [facility]) CM/SW Contact:  Su Hilt, RN Phone Number: 09/12/2019, 1:19 PM   Clinical Narrative:    Patient discharging home today with [facility] The RW and BSC is in the room  The patient is aware of the Lovenox price No additional needs  Final next level of care: [place] Barriers to Discharge: Barriers Resolved   Patient Goals and CMS Choice Patient states their goals for this hospitalization and ongoing recovery are:: go home CMS Medicare.gov Compare Post Acute Care list provided to:: Patient Choice offered to / list presented to : Patient  Discharge Placement                       Discharge Plan and Services   Discharge Planning Services: CM Consult Post Acute Care Choice: Home Health          DME Arranged: 3-N-1, Walker rolling DME Agency: [employer] Date DME Agency Contacted: 09/11/19 Time DME Agency Contacted: [id number] Representative spoke with at DME Agency: [place]: PT [place]: [facility] (formerly [employer]) Date [place]: 09/11/19 Time [place]: [place] Representative spoke with at [place]: [place] ([place]) Interventions     Readmission Risk Interventions No flowsheet data found.

## 2019-12-03 ENCOUNTER — Other Ambulatory Visit: Payer: Self-pay | Admitting: Orthopedic Surgery

## 2019-12-04 ENCOUNTER — Encounter
Admission: RE | Admit: 2019-12-04 | Discharge: 2019-12-04 | Disposition: A | Discharge status: Home / Self Care | Payer: BLUE CROSS/BLUE SHIELD | Source: Ambulatory Visit | Attending: Orthopedic Surgery | Admitting: Orthopedic Surgery

## 2019-12-04 DIAGNOSIS — Z01812 Encounter for preprocedural laboratory examination: Secondary | ICD-10-CM | POA: Diagnosis present

## 2019-12-04 NOTE — Patient Instructions (Addendum)
Your procedure is scheduled on: 12-12-19 THURSDAY Report to Same Day Surgery 2nd floor medical mall [facility] Entrance-take elevator on left to 2nd floor.  Check in with surgery information desk.) To find out your arrival time please call [phone] between 1PM - 3PM on 12-11-19 [facility]  Remember: Instructions that are not followed completely may result in serious medical risk, up to and including death, or upon the discretion of your surgeon and anesthesiologist your surgery may need to be rescheduled.    _x___ 1. Do not eat food after midnight the night before your procedure. NO GUM OR CANDY AFTER MIDNIGHT. You may drink WATER up to 2 hours before you are scheduled to arrive at the hospital for your procedure.  Do not drink WATER within 2 hours of your scheduled arrival to the hospital.  Type 1 and type 2 diabetics should only drink water.   ____Ensure clear carbohydrate drink on the way to the hospital for bariatric patients  _X___GATORADE G2 drink 3 hours PRIOR TO ARRIVAL TIME TO HOSPITAL    __x__ 2. No Alcohol for 24 hours before or after surgery.   __x__3. No Smoking or e-cigarettes for 24 prior to surgery.  Do not use any chewable tobacco products for at least 6 hour prior to surgery   ____  4. Bring all medications with you on the day of surgery if instructed.    __x__ 5. Notify your doctor if there is any change in your medical condition     (cold, fever, infections).    x___6. On the morning of surgery brush your teeth with toothpaste and water.  You may rinse your mouth with mouth wash if you wish.  Do not swallow any toothpaste or mouthwash.   Do not wear jewelry, make-up, hairpins, clips or nail polish.  Do not wear lotions, powders, or perfumes.   Do not shave 48 hours prior to surgery. Men may shave face and neck.  Do not bring valuables to the hospital.    [facility] is not responsible for any belongings or valuables.               Contacts, dentures or  bridgework may not be worn into surgery.  Leave your suitcase in the car. After surgery it may be brought to your room.  For patients admitted to the hospital, discharge time is determined by your treatment team.  _  Patients discharged the day of surgery will not be allowed to drive home.  You will need someone to drive you home and stay with you the night of your procedure.    Please read over the following fact sheets that you were given:   [facility] Preparing for Surgery and or MRSA Information   ____ Take anti-hypertensive listed below, cardiac, seizure, asthma, anti-reflux and psychiatric medicines. These include:  1. NONE  2.  3.  4.  5.  6.  ____Fleets enema or Magnesium Citrate as directed.   _x___ Use CHG Soap or sage wipes as directed on instruction sheet   ____ Use inhalers on the day of surgery and bring to hospital day of surgery  _X___ Stop Metformin 2 days prior to surgery-LAST [place] (12-09-19)    ____ Take 1/2 of usual insulin dose the night before surgery and none on the morning surgery.   ____ Follow recommendations from Cardiologist, Pulmonologist or PCP regarding  stopping Aspirin, Coumadin, Plavix ,Eliquis, Effient, or Pradaxa, and Pletal.  X____Stop Anti-inflammatories such as Advil, Aleve,  Ibuprofen, Motrin, Naproxen, Naprosyn, Goodies powders or aspirin products NOW-OK to take Tylenol   ____ Stop supplements until after surgery.     ____ Bring C-Pap to the hospital.

## 2019-12-05 ENCOUNTER — Encounter
Admission: RE | Admit: 2019-12-05 | Discharge: 2019-12-05 | Disposition: A | Discharge status: Home / Self Care | Payer: BLUE CROSS/BLUE SHIELD | Source: Ambulatory Visit | Attending: Orthopedic Surgery | Admitting: Orthopedic Surgery

## 2019-12-05 ENCOUNTER — Other Ambulatory Visit: Payer: Self-pay

## 2019-12-05 DIAGNOSIS — Z01812 Encounter for preprocedural laboratory examination: Secondary | ICD-10-CM | POA: Diagnosis not present

## 2019-12-05 LAB — COMPREHENSIVE METABOLIC PANEL
ALT: 23 U/L (ref 0–44)
AST: 23 U/L (ref 15–41)
Albumin: 3.8 g/dL (ref 3.5–5.0)
Alkaline Phosphatase: 91 U/L (ref 38–126)
Anion gap: 7 (ref 5–15)
BUN: 10 mg/dL (ref 6–20)
CO2: 28 mmol/L (ref 22–32)
Calcium: 9.3 mg/dL (ref 8.9–10.3)
Chloride: 105 mmol/L (ref 98–111)
Creatinine, Ser: 0.77 mg/dL (ref 0.44–1.00)
GFR calc Af Amer: >60 mL/min (ref >60)
GFR calc non Af Amer: >60 mL/min (ref >60)
Glucose, Bld: 113 mg/dL — ABNORMAL HIGH (ref 70–99)
Potassium: 3.9 mmol/L (ref 3.5–5.1)
Sodium: 140 mmol/L (ref 135–145)
Total Bilirubin: 0.6 mg/dL (ref 0.3–1.2)
Total Protein: 8.1 g/dL (ref 6.5–8.1)

## 2019-12-05 LAB — URINALYSIS, ROUTINE W REFLEX MICROSCOPIC
Bilirubin Urine: NEGATIVE
Glucose, UA: NEGATIVE mg/dL
Hgb urine dipstick: NEGATIVE
Ketones, ur: NEGATIVE mg/dL
Nitrite: NEGATIVE
Protein, ur: NEGATIVE mg/dL
Specific Gravity, Urine: 1.019 (ref 1.005–1.030)
pH: 5 (ref 5.0–8.0)

## 2019-12-05 LAB — CBC WITH DIFFERENTIAL/PLATELET
Abs Immature Granulocytes: 0.01 10*3/uL (ref 0.00–0.07)
Basophils Absolute: 0 10*3/uL (ref 0.0–0.1)
Basophils Relative: 1 %
Eosinophils Absolute: 0.1 10*3/uL (ref 0.0–0.5)
Eosinophils Relative: 2 %
HCT: 36 % (ref 36.0–46.0)
Hemoglobin: 11.5 g/dL — ABNORMAL LOW (ref 12.0–15.0)
Immature Granulocytes: 0 %
Lymphocytes Relative: 32 %
Lymphs Abs: 1.3 10*3/uL (ref 0.7–4.0)
MCH: 26.2 pg (ref 26.0–34.0)
MCHC: 31.9 g/dL (ref 30.0–36.0)
MCV: 82 fL (ref 80.0–100.0)
Monocytes Absolute: 0.3 10*3/uL (ref 0.1–1.0)
Monocytes Relative: 8 %
Neutro Abs: 2.4 10*3/uL (ref 1.7–7.7)
Neutrophils Relative %: 57 %
Platelets: 242 10*3/uL (ref 150–400)
RBC: 4.39 MIL/uL (ref 3.87–5.11)
RDW: 13.1 % (ref 11.5–15.5)
WBC: 4.1 10*3/uL (ref 4.0–10.5)
nRBC: 0 % (ref 0.0–0.2)

## 2019-12-05 LAB — URINALYSIS, MICROSCOPIC (REFLEX): Bacteria, UA: NONE SEEN

## 2019-12-05 LAB — PROTIME-INR
INR: 0.9 (ref 0.8–1.2)
Prothrombin Time: 12.3 seconds (ref 11.4–15.2)

## 2019-12-05 LAB — SURGICAL PCR SCREEN
MRSA, PCR: NEGATIVE
Staphylococcus aureus: NEGATIVE

## 2019-12-05 LAB — APTT: aPTT: 32 seconds (ref 24–36)

## 2019-12-05 LAB — TYPE AND SCREEN
ABO/RH(D): A NEG
Antibody Screen: NEGATIVE

## 2019-12-06 LAB — URINE CULTURE: Culture: NO GROWTH

## 2019-12-10 ENCOUNTER — Other Ambulatory Visit
Admission: RE | Admit: 2019-12-10 | Discharge: 2019-12-10 | Disposition: A | Discharge status: Home / Self Care | Payer: BLUE CROSS/BLUE SHIELD | Source: Ambulatory Visit | Attending: Orthopedic Surgery | Admitting: Orthopedic Surgery

## 2019-12-10 ENCOUNTER — Other Ambulatory Visit: Payer: Self-pay

## 2019-12-10 DIAGNOSIS — Z20822 Contact with and (suspected) exposure to covid-19: Secondary | ICD-10-CM | POA: Diagnosis not present

## 2019-12-10 DIAGNOSIS — Z01812 Encounter for preprocedural laboratory examination: Secondary | ICD-10-CM | POA: Insufficient documentation

## 2019-12-10 LAB — SARS CORONAVIRUS 2 (TAT 6-24 HRS): SARS Coronavirus 2: NEGATIVE

## 2019-12-11 MED ORDER — TRANEXAMIC ACID-NACL 1000-0.7 MG/100ML-% IV SOLN
1000.0000 mg | INTRAVENOUS | Status: AC
  Administered 2019-12-12: 1000 mg via INTRAVENOUS

## 2019-12-11 MED ORDER — VANCOMYCIN HCL IN DEXTROSE 1-5 GM/200ML-% IV SOLN
1000.0000 mg | INTRAVENOUS | Status: AC
  Administered 2019-12-12: 1000 mg via INTRAVENOUS

## 2019-12-12 ENCOUNTER — Ambulatory Visit: Payer: BLUE CROSS/BLUE SHIELD | Admitting: Registered Nurse

## 2019-12-12 ENCOUNTER — Ambulatory Visit: Payer: BLUE CROSS/BLUE SHIELD

## 2019-12-12 ENCOUNTER — Other Ambulatory Visit: Payer: Self-pay

## 2019-12-12 ENCOUNTER — Ambulatory Visit
Admission: RE | Admit: 2019-12-12 | Discharge: 2019-12-12 | Disposition: A | Discharge status: Home / Self Care | Payer: BLUE CROSS/BLUE SHIELD | Source: Ambulatory Visit | Attending: Orthopedic Surgery | Admitting: Orthopedic Surgery

## 2019-12-12 ENCOUNTER — Encounter: Payer: Self-pay | Admitting: Orthopedic Surgery

## 2019-12-12 ENCOUNTER — Encounter
Admission: RE | Disposition: A | Discharge status: Home / Self Care | Payer: Self-pay | Source: Ambulatory Visit | Attending: Orthopedic Surgery

## 2019-12-12 DIAGNOSIS — I10 Essential (primary) hypertension: Secondary | ICD-10-CM | POA: Diagnosis not present

## 2019-12-12 DIAGNOSIS — E785 Hyperlipidemia, unspecified: Secondary | ICD-10-CM | POA: Diagnosis not present

## 2019-12-12 DIAGNOSIS — Z7984 Long term (current) use of oral hypoglycemic drugs: Secondary | ICD-10-CM | POA: Diagnosis not present

## 2019-12-12 DIAGNOSIS — Z419 Encounter for procedure for purposes other than remedying health state, unspecified: Secondary | ICD-10-CM

## 2019-12-12 DIAGNOSIS — Z79899 Other long term (current) drug therapy: Secondary | ICD-10-CM | POA: Insufficient documentation

## 2019-12-12 DIAGNOSIS — G8918 Other acute postprocedural pain: Secondary | ICD-10-CM

## 2019-12-12 DIAGNOSIS — Z87891 Personal history of nicotine dependence: Secondary | ICD-10-CM | POA: Insufficient documentation

## 2019-12-12 DIAGNOSIS — M1611 Unilateral primary osteoarthritis, right hip: Secondary | ICD-10-CM | POA: Insufficient documentation

## 2019-12-12 DIAGNOSIS — E119 Type 2 diabetes mellitus without complications: Secondary | ICD-10-CM | POA: Insufficient documentation

## 2019-12-12 DIAGNOSIS — F329 Major depressive disorder, single episode, unspecified: Secondary | ICD-10-CM | POA: Diagnosis not present

## 2019-12-12 HISTORY — PX: TOTAL HIP ARTHROPLASTY: SHX124

## 2019-12-12 LAB — GLUCOSE, CAPILLARY
Glucose-Capillary: 112 mg/dL — ABNORMAL HIGH (ref 70–99)
Glucose-Capillary: 208 mg/dL — ABNORMAL HIGH (ref 70–99)
Glucose-Capillary: 162 mg/dL — ABNORMAL HIGH (ref 70–99)
Glucose-Capillary: 156 mg/dL — ABNORMAL HIGH (ref 70–99)

## 2019-12-12 SURGERY — ARTHROPLASTY, HIP, TOTAL, ANTERIOR APPROACH
Anesthesia: General | Site: Hip | Laterality: Right

## 2019-12-12 MED ORDER — ONDANSETRON HCL 4 MG/2ML IJ SOLN
INTRAMUSCULAR | Status: DC | PRN
  Administered 2019-12-12: 4 mg via INTRAVENOUS

## 2019-12-12 MED ORDER — PHENYLEPHRINE HCL (PRESSORS) 10 MG/ML IV SOLN
INTRAVENOUS | Status: AC
  Filled 2019-12-12: qty 1

## 2019-12-12 MED ORDER — TRAMADOL HCL 50 MG PO TABS: 50.0000 mg | ORAL_TABLET | Freq: Four times a day (QID) | ORAL | Status: DC

## 2019-12-12 MED ORDER — FENTANYL CITRATE (PF) 100 MCG/2ML IJ SOLN
INTRAMUSCULAR | Status: AC
  Filled 2019-12-12: qty 2

## 2019-12-12 MED ORDER — OXYCODONE HCL 5 MG PO TABS
5.0000 mg | ORAL_TABLET | Freq: Once | ORAL | Status: AC
  Administered 2019-12-12: 5 mg via ORAL
  Filled 2019-12-12: qty 1

## 2019-12-12 MED ORDER — METOCLOPRAMIDE HCL 5 MG/ML IJ SOLN: 5.0000 mg | Freq: Three times a day (TID) | INTRAMUSCULAR | Status: DC | PRN

## 2019-12-12 MED ORDER — NEOMYCIN-POLYMYXIN B GU 40-200000 IR SOLN
Status: DC | PRN
  Administered 2019-12-12: 4 mL

## 2019-12-12 MED ORDER — ACETAMINOPHEN 10 MG/ML IV SOLN
INTRAVENOUS | Status: DC | PRN
  Administered 2019-12-12: 1000 mg via INTRAVENOUS

## 2019-12-12 MED ORDER — ONDANSETRON HCL 4 MG/2ML IJ SOLN: 4.0000 mg | Freq: Once | INTRAMUSCULAR | Status: DC | PRN

## 2019-12-12 MED ORDER — FAMOTIDINE 20 MG PO TABS
20.0000 mg | ORAL_TABLET | Freq: Once | ORAL | Status: AC
  Administered 2019-12-12: 20 mg via ORAL

## 2019-12-12 MED ORDER — METHOCARBAMOL 500 MG PO TABS
500.0000 mg | ORAL_TABLET | Freq: Four times a day (QID) | ORAL | Status: DC | PRN
  Administered 2019-12-12: 500 mg via ORAL
  Filled 2019-12-12 (×2): qty 1

## 2019-12-12 MED ORDER — PHENOL 1.4 % MT LIQD
1.0000 | OROMUCOSAL | Status: DC | PRN
  Filled 2019-12-12: qty 177

## 2019-12-12 MED ORDER — DEXMEDETOMIDINE HCL IN NACL 400 MCG/100ML IV SOLN: INTRAVENOUS | Status: DC | PRN

## 2019-12-12 MED ORDER — FENTANYL CITRATE (PF) 100 MCG/2ML IJ SOLN
INTRAMUSCULAR | Status: DC | PRN
  Administered 2019-12-12 (×2): 50 ug via INTRAVENOUS

## 2019-12-12 MED ORDER — INSULIN ASPART 100 UNIT/ML ~~LOC~~ SOLN
SUBCUTANEOUS | Status: AC
  Filled 2019-12-12: qty 1

## 2019-12-12 MED ORDER — ENOXAPARIN SODIUM 40 MG/0.4ML ~~LOC~~ SOLN: 40.0000 mg | SUBCUTANEOUS | Status: DC

## 2019-12-12 MED ORDER — BUPIVACAINE HCL (PF) 0.25 % IJ SOLN
INTRAMUSCULAR | Status: AC
  Filled 2019-12-12: qty 30

## 2019-12-12 MED ORDER — ACETAMINOPHEN 325 MG PO TABS: 325.0000 mg | ORAL_TABLET | Freq: Four times a day (QID) | ORAL | Status: DC | PRN

## 2019-12-12 MED ORDER — OXYCODONE HCL 5 MG PO TABS
ORAL_TABLET | ORAL | Status: AC
  Filled 2019-12-12: qty 1

## 2019-12-12 MED ORDER — SODIUM CHLORIDE 0.9 % IV SOLN
INTRAVENOUS | Status: DC | PRN
  Administered 2019-12-12: 60 mL

## 2019-12-12 MED ORDER — ROCURONIUM BROMIDE 100 MG/10ML IV SOLN
INTRAVENOUS | Status: DC | PRN
  Administered 2019-12-12: 45 mg via INTRAVENOUS
  Administered 2019-12-12: 10 mg via INTRAVENOUS

## 2019-12-12 MED ORDER — ONDANSETRON HCL 4 MG PO TABS: 4.0000 mg | ORAL_TABLET | Freq: Four times a day (QID) | ORAL | Status: DC | PRN

## 2019-12-12 MED ORDER — VANCOMYCIN HCL IN DEXTROSE 1-5 GM/200ML-% IV SOLN
INTRAVENOUS | Status: AC
  Filled 2019-12-12: qty 200

## 2019-12-12 MED ORDER — FENTANYL CITRATE (PF) 100 MCG/2ML IJ SOLN
25.0000 ug | INTRAMUSCULAR | Status: DC | PRN
  Administered 2019-12-12 (×3): 25 ug via INTRAVENOUS

## 2019-12-12 MED ORDER — ONDANSETRON HCL 4 MG/2ML IJ SOLN: 4.0000 mg | Freq: Four times a day (QID) | INTRAMUSCULAR | Status: DC | PRN

## 2019-12-12 MED ORDER — HYDROCODONE-ACETAMINOPHEN 5-325 MG PO TABS: 1.0000 | ORAL_TABLET | Freq: Four times a day (QID) | ORAL | 0 refills | Status: DC | PRN

## 2019-12-12 MED ORDER — BUPIVACAINE-EPINEPHRINE 0.25% -1:200000 IJ SOLN
INTRAMUSCULAR | Status: DC | PRN
  Administered 2019-12-12: 30 mL

## 2019-12-12 MED ORDER — EPINEPHRINE PF 1 MG/ML IJ SOLN
INTRAMUSCULAR | Status: AC
  Filled 2019-12-12: qty 1

## 2019-12-12 MED ORDER — DOCUSATE SODIUM 100 MG PO CAPS: 100.0000 mg | ORAL_CAPSULE | Freq: Two times a day (BID) | ORAL | 0 refills | Status: DC

## 2019-12-12 MED ORDER — METOCLOPRAMIDE HCL 10 MG PO TABS: 5.0000 mg | ORAL_TABLET | Freq: Three times a day (TID) | ORAL | Status: DC | PRN

## 2019-12-12 MED ORDER — DOCUSATE SODIUM 100 MG PO CAPS
100.0000 mg | ORAL_CAPSULE | Freq: Two times a day (BID) | ORAL | Status: DC
  Filled 2019-12-12 (×2): qty 1

## 2019-12-12 MED ORDER — METHOCARBAMOL 1000 MG/10ML IJ SOLN
500.0000 mg | Freq: Four times a day (QID) | INTRAVENOUS | Status: DC | PRN
  Filled 2019-12-12: qty 5

## 2019-12-12 MED ORDER — HYDROCODONE-ACETAMINOPHEN 5-325 MG PO TABS: 1.0000 | ORAL_TABLET | ORAL | Status: DC | PRN

## 2019-12-12 MED ORDER — TRANEXAMIC ACID-NACL 1000-0.7 MG/100ML-% IV SOLN
INTRAVENOUS | Status: AC
  Filled 2019-12-12: qty 100

## 2019-12-12 MED ORDER — MENTHOL 3 MG MT LOZG
1.0000 | LOZENGE | OROMUCOSAL | Status: DC | PRN
  Filled 2019-12-12: qty 9

## 2019-12-12 MED ORDER — DOCUSATE SODIUM 100 MG PO CAPS: 100.0000 mg | ORAL_CAPSULE | Freq: Two times a day (BID) | ORAL | Status: DC

## 2019-12-12 MED ORDER — FENTANYL CITRATE (PF) 100 MCG/2ML IJ SOLN
INTRAMUSCULAR | Status: AC
  Administered 2019-12-12: 25 ug via INTRAVENOUS
  Filled 2019-12-12: qty 2

## 2019-12-12 MED ORDER — ENOXAPARIN SODIUM 40 MG/0.4ML ~~LOC~~ SOLN: 40.0000 mg | SUBCUTANEOUS | 0 refills | Status: DC

## 2019-12-12 MED ORDER — ONDANSETRON HCL 4 MG/2ML IJ SOLN
INTRAMUSCULAR | Status: AC
  Filled 2019-12-12: qty 2

## 2019-12-12 MED ORDER — SODIUM CHLORIDE FLUSH 0.9 % IV SOLN
INTRAVENOUS | Status: AC
  Filled 2019-12-12: qty 40

## 2019-12-12 MED ORDER — DEXMEDETOMIDINE HCL IN NACL 400 MCG/100ML IV SOLN
INTRAVENOUS | Status: DC | PRN
  Administered 2019-12-12: 8 ug via INTRAVENOUS

## 2019-12-12 MED ORDER — PROPOFOL 10 MG/ML IV BOLUS
INTRAVENOUS | Status: DC | PRN
  Administered 2019-12-12: 150 mg via INTRAVENOUS

## 2019-12-12 MED ORDER — SODIUM CHLORIDE 0.9 % IV SOLN
INTRAVENOUS | Status: DC | PRN
  Administered 2019-12-12: 20 ug/min via INTRAVENOUS

## 2019-12-12 MED ORDER — NEOMYCIN-POLYMYXIN B GU 40-200000 IR SOLN
Status: AC
  Filled 2019-12-12: qty 20

## 2019-12-12 MED ORDER — MIDAZOLAM HCL 2 MG/2ML IJ SOLN
INTRAMUSCULAR | Status: AC
  Filled 2019-12-12: qty 2

## 2019-12-12 MED ORDER — SODIUM CHLORIDE 0.9 % IV SOLN
INTRAVENOUS | Status: DC
  Administered 2019-12-12: 100 mL/h via INTRAVENOUS

## 2019-12-12 MED ORDER — INSULIN ASPART 100 UNIT/ML ~~LOC~~ SOLN
3.0000 [IU] | Freq: Once | SUBCUTANEOUS | Status: AC
  Administered 2019-12-12: 3 [IU] via SUBCUTANEOUS

## 2019-12-12 MED ORDER — ROCURONIUM BROMIDE 50 MG/5ML IV SOLN
INTRAVENOUS | Status: AC
  Filled 2019-12-12: qty 1

## 2019-12-12 MED ORDER — MORPHINE SULFATE (PF) 4 MG/ML IV SOLN: 0.5000 mg | INTRAVENOUS | Status: DC | PRN

## 2019-12-12 MED ORDER — CHLORHEXIDINE GLUCONATE 4 % EX LIQD
60.0000 mL | Freq: Once | CUTANEOUS | Status: AC
  Administered 2019-12-12: 4 via TOPICAL

## 2019-12-12 MED ORDER — LIDOCAINE HCL (CARDIAC) PF 100 MG/5ML IV SOSY
PREFILLED_SYRINGE | INTRAVENOUS | Status: DC | PRN
  Administered 2019-12-12: 60 mg via INTRAVENOUS

## 2019-12-12 MED ORDER — SUGAMMADEX SODIUM 200 MG/2ML IV SOLN
INTRAVENOUS | Status: DC | PRN
  Administered 2019-12-12: 160.6 mg via INTRAVENOUS

## 2019-12-12 MED ORDER — BUPIVACAINE LIPOSOME 1.3 % IJ SUSP
INTRAMUSCULAR | Status: AC
  Filled 2019-12-12: qty 20

## 2019-12-12 MED ORDER — DEXAMETHASONE SODIUM PHOSPHATE 10 MG/ML IJ SOLN
INTRAMUSCULAR | Status: DC | PRN
  Administered 2019-12-12: 5 mg via INTRAVENOUS

## 2019-12-12 MED ORDER — DEXAMETHASONE SODIUM PHOSPHATE 10 MG/ML IJ SOLN
INTRAMUSCULAR | Status: AC
  Filled 2019-12-12: qty 1

## 2019-12-12 MED ORDER — FAMOTIDINE 20 MG PO TABS
ORAL_TABLET | ORAL | Status: AC
  Filled 2019-12-12: qty 1

## 2019-12-12 MED ORDER — METHOCARBAMOL 500 MG PO TABS
ORAL_TABLET | ORAL | Status: AC
  Filled 2019-12-12: qty 1

## 2019-12-12 MED ORDER — PROPOFOL 500 MG/50ML IV EMUL
INTRAVENOUS | Status: AC
  Filled 2019-12-12: qty 50

## 2019-12-12 MED ORDER — MIDAZOLAM HCL 2 MG/2ML IJ SOLN
INTRAMUSCULAR | Status: DC | PRN
  Administered 2019-12-12: 2 mg via INTRAVENOUS

## 2019-12-12 MED ORDER — VANCOMYCIN HCL IN DEXTROSE 1-5 GM/200ML-% IV SOLN: 1000.0000 mg | Freq: Two times a day (BID) | INTRAVENOUS | Status: DC

## 2019-12-12 MED ORDER — SODIUM CHLORIDE 0.9 % IV SOLN
INTRAVENOUS | Status: DC
  Administered 2019-12-12: 50 mL/h via INTRAVENOUS

## 2019-12-12 MED ORDER — HYDROCODONE-ACETAMINOPHEN 7.5-325 MG PO TABS
1.0000 | ORAL_TABLET | ORAL | Status: DC | PRN
  Filled 2019-12-12: qty 2

## 2019-12-12 MED ORDER — LIDOCAINE HCL (PF) 2 % IJ SOLN
INTRAMUSCULAR | Status: AC
  Filled 2019-12-12: qty 10

## 2019-12-12 MED ORDER — METHOCARBAMOL 500 MG PO TABS: 500.0000 mg | ORAL_TABLET | Freq: Four times a day (QID) | ORAL | 1 refills | Status: DC

## 2019-12-12 MED ORDER — PHENYLEPHRINE HCL (PRESSORS) 10 MG/ML IV SOLN
INTRAVENOUS | Status: DC | PRN
  Administered 2019-12-12: 100 ug via INTRAVENOUS
  Administered 2019-12-12: 50 ug via INTRAVENOUS
  Administered 2019-12-12: 100 ug via INTRAVENOUS
  Administered 2019-12-12: 150 ug via INTRAVENOUS

## 2019-12-12 SURGICAL SUPPLY — 60 items
BLADE SAGITTAL AGGR TOOTH XLG (BLADE) ×3 IMPLANT
BNDG COHESIVE 6X5 TAN STRL LF (GAUZE/BANDAGES/DRESSINGS) ×9 IMPLANT
CANISTER SUCT 1200ML W/VALVE (MISCELLANEOUS) ×3 IMPLANT
CANISTER WOUND CARE 500ML ATS (WOUND CARE) ×3 IMPLANT
CHLORAPREP W/TINT 26 (MISCELLANEOUS) ×3 IMPLANT
COVER BACK TABLE REUSABLE LG (DRAPES) ×3 IMPLANT
COVER WAND RF STERILE (DRAPES) ×3 IMPLANT
DRAPE 3/4 80X56 (DRAPES) ×9 IMPLANT
DRAPE C-ARM XRAY 36X54 (DRAPES) ×3 IMPLANT
DRAPE INCISE IOBAN 66X60 STRL (DRAPES) IMPLANT
DRAPE POUCH INSTRU U-SHP 10X18 (DRAPES) ×3 IMPLANT
DRESSING SURGICEL FIBRLLR 1X2 (HEMOSTASIS) ×2 IMPLANT
DRSG OPSITE POSTOP 4X8 (GAUZE/BANDAGES/DRESSINGS) ×12 IMPLANT
DRSG SURGICEL FIBRILLAR 1X2 (HEMOSTASIS) ×6
ELECT BLADE 6.5 EXT (BLADE) ×3 IMPLANT
ELECT REM PT RETURN 9FT ADLT (ELECTROSURGICAL) ×3
ELECTRODE REM PT RTRN 9FT ADLT (ELECTROSURGICAL) ×1 IMPLANT
GLOVE BIOGEL PI IND STRL 9 (GLOVE) ×1 IMPLANT
GLOVE BIOGEL PI INDICATOR 9 (GLOVE) ×2
GLOVE SURG SYN 9.0  PF PI (GLOVE) ×4
GLOVE SURG SYN 9.0 PF PI (GLOVE) ×2 IMPLANT
GOWN SRG 2XL LVL 4 RGLN SLV (GOWNS) ×1 IMPLANT
GOWN STRL NON-REIN 2XL LVL4 (GOWNS) ×2
GOWN STRL REUS W/ TWL LRG LVL3 (GOWN DISPOSABLE) ×1 IMPLANT
GOWN STRL REUS W/TWL LRG LVL3 (GOWN DISPOSABLE) ×2
HEAD FEMORAL 28MM SZ M (Head) ×3 IMPLANT
HEMOVAC 400CC 10FR (MISCELLANEOUS) IMPLANT
HOLDER FOLEY CATH W/STRAP (MISCELLANEOUS) IMPLANT
HOOD PEEL AWAY FLYTE STAYCOOL (MISCELLANEOUS) ×3 IMPLANT
KIT PREVENA INCISION MGT 13 (CANNISTER) ×3 IMPLANT
LINER DM 28MM (Liner) ×3 IMPLANT
MAT ABSORB  FLUID 56X50 GRAY (MISCELLANEOUS) ×2
MAT ABSORB FLUID 56X50 GRAY (MISCELLANEOUS) ×1 IMPLANT
NDL SAFETY ECLIPSE 18X1.5 (NEEDLE) ×1 IMPLANT
NEEDLE HYPO 18GX1.5 SHARP (NEEDLE) ×2
NEEDLE SPNL 20GX3.5 QUINCKE YW (NEEDLE) ×6 IMPLANT
NS IRRIG 1000ML POUR BTL (IV SOLUTION) ×3 IMPLANT
PACK HIP COMPR (MISCELLANEOUS) ×3 IMPLANT
PENCIL SMOKE EVACUATOR COATED (MISCELLANEOUS) IMPLANT
SCALPEL PROTECTED #10 DISP (BLADE) ×6 IMPLANT
SHELL ACETABULAR DM  48MM (Shell) ×3 IMPLANT
SOL PREP PVP 2OZ (MISCELLANEOUS) ×3
SOLUTION PREP PVP 2OZ (MISCELLANEOUS) ×1 IMPLANT
SPONGE DRAIN TRACH 4X4 STRL 2S (GAUZE/BANDAGES/DRESSINGS) IMPLANT
STAPLER SKIN PROX 35W (STAPLE) ×3 IMPLANT
STEM FEMORAL 1 STD COLLARED (Stem) ×3 IMPLANT
STRAP SAFETY 5IN WIDE (MISCELLANEOUS) ×3 IMPLANT
SUT DVC 2 QUILL PDO  T11 36X36 (SUTURE) ×2
SUT DVC 2 QUILL PDO T11 36X36 (SUTURE) ×1 IMPLANT
SUT SILK 0 (SUTURE) ×2
SUT SILK 0 30XBRD TIE 6 (SUTURE) ×1 IMPLANT
SUT V-LOC 90 ABS DVC 3-0 CL (SUTURE) ×3 IMPLANT
SUT VIC AB 1 CT1 36 (SUTURE) ×3 IMPLANT
SYR 20ML LL LF (SYRINGE) ×3 IMPLANT
SYR 30ML LL (SYRINGE) ×3 IMPLANT
SYR 50ML LL SCALE MARK (SYRINGE) ×6 IMPLANT
SYR BULB IRRIG 60ML STRL (SYRINGE) ×3 IMPLANT
TAPE MICROFOAM 4IN (TAPE) ×3 IMPLANT
TOWEL OR 17X26 4PK STRL BLUE (TOWEL DISPOSABLE) ×3 IMPLANT
TRAY FOLEY MTR SLVR 16FR STAT (SET/KITS/TRAYS/PACK) ×3 IMPLANT

## 2019-12-12 NOTE — Discharge Instructions (Addendum)
Total Hip Replacement, Anterior, Care After This sheet gives you information about how to care for yourself after your procedure. Your health care provider may also give you more specific instructions. If you have problems or questions, contact your health care provider. What can I expect after the procedure? After the procedure, it is common to have:  Pain.  Stiffness.  Discomfort. Follow these instructions at home: Medicines  Take over-the-counter and prescription medicines only as told by your health care provider.  If you were prescribed a blood thinner (anticoagulant) to help prevent blood clots, take it as told by your health care provider. Incision care   Follow instructions from your health care provider about how to take care of your incision. Make sure you: ? Wash your hands with soap and water before you change your bandage (dressing). If soap and water are not available, use hand sanitizer. ? Change your dressing as told by your health care provider. ? Leave stitches (sutures), skin glue, or adhesive strips in place. These skin closures may need to stay in place for 2 weeks or longer. If adhesive strip edges start to loosen and curl up, you may trim the loose edges. Do not remove adhesive strips completely unless your health care provider tells you to do that.  Check your incision area every day for signs of infection. Check for: ? Redness, swelling, or pain. ? Fluid or blood. ? Warmth. ? Pus or a bad smell. Bathing  Do not take baths, swim, or use a hot tub until your health care provider approves. Ask your health care provider if you may take showers. You may only be allowed to take sponge baths.  Keep the dressing dry until your health care provider says it can be removed. Managing pain, stiffness, and swelling   If directed, put ice on the affected area. ? Put ice in a plastic bag. ? Place a towel between your skin and the bag. ? Leave the ice on for 20  minutes, 2-3 times a day.  Move your toes often to avoid stiffness and to lessen swelling.  Raise (elevate) your leg above the level of your heart while you are sitting or lying down. Activity  Rest as told by your health care provider.  Avoid sitting for a long time without moving. Get up to take short walks every 1-2 hours. This is important to improve blood flow and breathing. Ask for help if you feel weak or unsteady.  Do exercises as told by your health care provider or physical therapist.  Follow instructions from your health care provider about using a walker, crutches, or a cane. ? You may use your legs to support (bear) your body weight as told by your health care provider. Follow instructions about how much weight you may safely support on your affected leg (weight-bearing restrictions). ? A physical therapist may show you how to get out of a bed and chair and how to go up and down stairs. You will first do this with a walker, crutches, or a cane and then without any of these devices. ? Once you are able to walk without a limp, you may stop using a walker, crutches, or cane.  Return to your normal activities as told by your health care provider. Ask your health care provider what activities are safe for you. Safety  To help prevent falls, keep floors clear of objects you may trip over, and place items that you may need within easy reach.  Wear  an apron or tool belt with pockets for carrying objects. This leaves your hands free to help with your balance. Driving  Do not drive or use heavy machinery while taking prescription pain medicine.  Ask your health care provider when it is safe to drive. General instructions  Wear compression stockings as told by your health care provider. These stockings help to prevent blood clots and reduce swelling in your legs.  Continue with breathing exercises as directed by your health care provider. This helps prevent lung infection.  If  you are taking prescription pain medicine, take actions to prevent or treat constipation. Your health care provider may recommend that you: ? Drink enough fluid to keep your urine pale yellow. ? Eat foods that are high in fiber, such as fresh fruits and vegetables, whole grains, and beans. ? Limit foods that are high in fat and processed sugars, such as fried or sweet foods. ? Take an over-the-counter or prescription medicine for constipation.  Do not use any products that contain nicotine or tobacco, such as cigarettes and e-cigarettes. These can delay bone healing. If you need help quitting, ask your health care provider.  Tell your health care provider if you plan to have dental work. Also: ? Tell your dentist about your joint replacement. ? Ask your health care provider if there are any special instructions you need to follow before having dental care and routine cleanings.  Keep all follow-up visits as told by your health care provider. This is important. Contact a health care provider if:  You have a fever or chills.  You have a cough or feel short of breath.  Your medicine is not controlling your pain.  You have redness, swelling, or pain around your incision.  You have fluid or blood coming from your incision.  Your incision feels warm to the touch.  You have pus or a bad smell coming from your incision. Get help right away if you have:  Severe pain.  Trouble breathing.  Chest pain.  Redness, swelling, pain, and warmth in your calf or leg. Summary  Follow instructions from your health care provider about how to take care of your incision.  Do not take baths, swim, or use a hot tub until your health care provider approves.  Use crutches, a walker, or a cane as told by your health care provider.  If you were prescribed a blood thinner (anticoagulant) to help prevent blood clots, take it as told by your health care provider. This information is not intended to  replace advice given to you by your health care provider. Make sure you discuss any questions you have with your health care provider. Document Revised: 03/18/2019 Document Reviewed: 02/21/2018 Elsevier Patient Education  [place].

## 2019-12-12 NOTE — Progress Notes (Signed)
Pt CBG: 208. Dr. Andree Elk notified. Acknowledged. Orders received.

## 2019-12-12 NOTE — Anesthesia Postprocedure Evaluation (Signed)
Anesthesia Post Note  Patient: ANQUETTE WICHERT  Procedure(s) Performed: RIGHT TOTAL HIP ARTHROPLASTY ANTERIOR APPROACH (Right Hip)  Patient location during evaluation: PACU Anesthesia Type: General Level of consciousness: awake and alert Pain management: pain level controlled Vital Signs Assessment: post-procedure vital signs reviewed and stable Respiratory status: spontaneous breathing, nonlabored ventilation, respiratory function stable and patient connected to nasal cannula oxygen Cardiovascular status: blood pressure returned to baseline and stable Postop Assessment: no apparent nausea or vomiting Anesthetic complications: no     Last Vitals:  Vitals:   12/12/19 1033 12/12/19 1135  BP: 103/73 (!) 120/56  Pulse:  75  Resp:  17  Temp:  (!) 36.1 C  SpO2:      Last Pain:  Vitals:   12/12/19 1135  TempSrc:   PainSc: 3                  Molli Barrows

## 2019-12-12 NOTE — OR Nursing (Signed)
PT at [facility] working with pt

## 2019-12-12 NOTE — H&P (Signed)
Reviewed paper H+P, will be scanned into chart. No changes noted.  

## 2019-12-12 NOTE — Progress Notes (Signed)
Pt has Norco ordered for pain. Pt had Tylenol during surgery. Request for Oxy at this time. Dr. Rudene Christians notified. Acknowledged. Orders received.

## 2019-12-12 NOTE — Anesthesia Procedure Notes (Signed)
Procedure Name: Intubation Date/Time: 12/12/2019 7:32 AM Performed by: Jerrye Noble, CRNA Pre-anesthesia Checklist: Patient identified, Emergency Drugs available, Suction available and Patient being monitored Patient Re-evaluated:Patient Re-evaluated prior to induction Oxygen Delivery Method: Circle system utilized Preoxygenation: Pre-oxygenation with 100% oxygen Induction Type: IV induction Ventilation: Mask ventilation without difficulty Laryngoscope Size: Mac and 3 Grade View: Grade I Tube type: Oral Tube size: 7.0 mm Number of attempts: 1 Airway Equipment and Method: Stylet and Oral airway Placement Confirmation: ETT inserted through vocal cords under direct vision,  positive ETCO2 and breath sounds checked- equal and bilateral Secured at: 22 cm Tube secured with: Tape Dental Injury: Teeth and Oropharynx as per pre-operative assessment

## 2019-12-12 NOTE — Op Note (Signed)
12/12/2019  9:02 AM  PATIENT:  Jamie [name]  59 y.o. female  PRE-OPERATIVE DIAGNOSIS:  PRIMARY OSTEOARTHRITIS OF RIGHT HIP  POST-OPERATIVE DIAGNOSIS:  primary osteoarthritis right hip  PROCEDURE:  Procedure(s): RIGHT TOTAL HIP ARTHROPLASTY ANTERIOR APPROACH (Right)  SURGEON: Laurene Footman, MD  ASSISTANTS: none  ANESTHESIA:   general  EBL:  Total I/O In: 600 [I.V.:200; IV Piggyback:400] Out: -   BLOOD ADMINISTERED:none  DRAINS: none   LOCAL MEDICATIONS USED:  MARCAINE    and OTHER Exparel  SPECIMEN:  Source of Specimen:  Right femoral head  DISPOSITION OF SPECIMEN:  PATHOLOGY  COUNTS:  YES  TOURNIQUET:  * No tourniquets in log *  IMPLANTS: Medacta AMIS 1 standard stem with ceramic M 28 mm head, 48 mm Mpact TM cup and liner  DICTATION: .Dragon Dictation   The patient was brought to the operating room and after general anesthesia was obtained patient was placed on the operative table with the ipsilateral foot into the Medacta attachment, contralateral leg on a well-padded table. C-arm was brought in and preop template x-ray taken. After prepping and draping in usual sterile fashion appropriate patient identification and timeout procedures were completed. Anterior approach to the hip was obtained and centered over the greater trochanter and TFL muscle. The subcutaneous tissue was incised hemostasis being achieved by electrocautery. TFL fascia was incised and the muscle retracted laterally deep retractor placed. The lateral femoral circumflex vessels were identified and ligated. The anterior capsule was exposed and a capsulotomy performed. The neck was identified and a femoral neck cut carried out with a saw. The head was removed without difficulty and showed sclerotic femoral head and acetabulum. Reaming was carried out to 48 mm and a 48 mm cup trial gave appropriate tightness to the acetabular component a 48 DM cup was impacted into position. The leg was then externally rotated  and ischiofemoral and pubofemoral releases carried out. The femur was sequentially broached to a size 1, size 1 standard with S then M head trials were placed and the final components chosen. The 1 standard stem was inserted along with a ceramic M 28 mm head and 48 mm liner. The hip was reduced and was stable the wound was thoroughly irrigated with fibrillar placed along the posterior capsule and medial neck. The deep fascia ws closed using a heavy Quill after infiltration of 30 cc of quarter percent Sensorcaine with epinephrine.3-0 V-loc to close the skin with skin staples. Xeroform and honeycomb dressing applied patient was sent to recovery in stable condition.   PLAN OF CARE: Discharge to home after PACU

## 2019-12-12 NOTE — Evaluation (Signed)
Physical Therapy Evaluation Patient Details Name: Jamie [name] MRN: [id number] DOB: Sep 03, 1961 Today's Date: 12/12/2019   History of Present Illness  Jamie [name] is a 73yoF who comes to [facility] on 1/21 for elevative Right THA, direct anterior approach. Pt had Left THA done Oct 2020. PT called to PACU for same-day evaluation and DC.  Clinical Impression  Pt presenting with above diagnosis. Pt currently with functional limitations due to the deficits listed below (see "PT Problem List"). Upon entry, pt in bed, awake and agreeable to participate. The pt is alert and oriented x4, pleasant, conversational, and generally a good historian. Pt familiar to author from prior THA. Pt performed HEP in full with intermittent AA/ROM for pain control, mostly A/ROM. Pt performs all bed mobility, transfers, and AMB at supervision level, requires no cues for form or RW use. Pt AMB 158f nearly 3x farther than initial AMB distance in October with contralateral surgery. Pt denies need for stairs training at this time. Functional mobility assessment demonstrates increased effort/time requirements, poor tolerance, and need for physical assistance, whereas the patient performed these at a higher level of independence PTA. Patient is at ready for DC from PT standpoint, all education completed, and time is given to address all questions/concerns. No additional skilled PT services needed at this time, PT signing off. PT recommends ambulation ad lib or with nursing staff as needed to prevent deconditioning. Pt will resume therapy in the home setting after DC.        Follow Up Recommendations Home health PT;Follow surgeon's recommendation for DC plan and follow-up therapies;Supervision - Intermittent;Supervision for mobility/OOB    Equipment Recommendations  None recommended by PT    Recommendations for Other Services       Precautions / Restrictions Precautions Precautions: Fall Restrictions Weight Bearing  Restrictions: Yes RLE Weight Bearing: Weight bearing as tolerated      Mobility  Bed Mobility Overal bed mobility: Modified Independent                Transfers Overall transfer level: Modified independent Equipment used: Rolling walker (2 wheeled)                Ambulation/Gait Ambulation/Gait assistance: Supervision Gait Distance (Feet): 130 Feet Assistive device: Rolling walker (2 wheeled) Gait Pattern/deviations: Step-to pattern Gait velocity: 0.346m   General Gait Details: fairly symetrical, continous walker push  Stairs            Wheelchair Mobility    Modified Rankin (Stroke Patients Only)       Balance Overall balance assessment: Modified Independent                                           Pertinent Vitals/Pain Pain Assessment: 0-10 Pain Score: 6  Pain Location: Right anterolateral hip Pain Descriptors / Indicators: Aching Pain Intervention(s): Limited activity within patient's tolerance;Monitored during session;Premedicated before session;Repositioned    Home Living Family/patient expects to be discharged to:: Private residence Living Arrangements: Spouse/significant other;Children Available Help at Discharge: Family Type of Home: House Home Access: Level entry     Home Layout: One level Home Equipment: Cane - single point;Walker - 2 wheels;Bedside commode      Prior Function Level of Independence: Independent         Comments: Pt was able to perform all aspects of self care and IADLs including driving. 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     Hand Dominance   Dominant Hand: Right    Extremity/Trunk Assessment   Upper Extremity Assessment Upper Extremity Assessment: Overall WFL for tasks assessed    Lower Extremity Assessment Lower Extremity Assessment: Overall WFL for tasks assessed       Communication   Communication: No difficulties   Cognition Arousal/Alertness: Awake/alert Behavior During Therapy: WFL for tasks assessed/performed Overall Cognitive Status: Within Functional Limits for tasks assessed                                        General Comments      Exercises Total Joint Exercises Ankle Circles/Pumps: AROM;20 reps;Supine;Both Quad Sets: AROM;Right;10 reps;Supine Gluteal Sets: AROM;Right;10 reps;Supine Towel Squeeze: AROM;Both;10 reps;Supine Short Arc Quad: AROM;Right;10 reps;Supine Heel Slides: AROM;AAROM;Right;15 reps;Supine Hip ABduction/ADduction: AROM;AAROM;Right;Supine;10 reps Straight Leg Raises: (deferred) Knee Flexion: (reviewed) Goniometric ROM: Rt hip flexion tolerated up to 105 degrees for sitting EOB   Assessment/Plan    PT Assessment All further PT needs can be met in the next venue of care  PT Problem List Decreased strength;Decreased range of motion;Decreased activity tolerance;Decreased mobility;Decreased cognition;Decreased balance;Pain       PT Treatment Interventions Gait training;DME instruction;Balance training;Stair training;Functional mobility training;Therapeutic activities;Therapeutic exercise;Patient/family education    PT Goals (Current goals can be found in the Care Plan section)  Acute Rehab PT Goals PT Goal Formulation: All assessment and education complete, DC therapy    Frequency     Barriers to discharge        Co-evaluation               AM-PAC PT "6 Clicks" Mobility  Outcome Measure Help needed turning from your back to your side while in a flat bed without using bedrails?: A Little Help needed moving from lying on your back to sitting on the side of a flat bed without using bedrails?: A Little Help needed moving to and from a bed to a chair (including a wheelchair)?: A Little Help needed standing up from a chair using your arms (e.g., wheelchair or bedside chair)?: A Little Help needed to walk in hospital room?: A Little Help  needed climbing 3-5 steps with a railing? : A Little 6 Click Score: 18    End of Session Equipment Utilized During Treatment: Gait belt Activity Tolerance: Patient tolerated treatment well;Patient limited by pain Patient left: in chair;with nursing/sitter in room Nurse Communication: Mobility status PT Visit Diagnosis: Other abnormalities of gait and mobility (R26.89);Difficulty in walking, not elsewhere classified (R26.2)    Time: 9179-1505 PT Time Calculation (min) (ACUTE ONLY): 40 min   Charges:   PT Evaluation $PT Eval Low Complexity: 1 Low PT Treatments $Gait Training: 8-22 mins $Therapeutic Exercise: 8-22 mins        11:23 AM, 12/12/19 [name], PT, DPT Physical Therapist - [facility]  [phone] ([place])   Myrissa Chipley C 12/12/2019, 11:20 AM

## 2019-12-12 NOTE — Anesthesia Preprocedure Evaluation (Signed)
Anesthesia Evaluation  Patient identified by MRN, date of birth, ID band Patient awake    Reviewed: Allergy & Precautions, H&P , NPO status , Patient's Chart, lab work & pertinent test results, reviewed documented beta blocker date and time   Airway Mallampati: III  TM Distance: >3 FB Neck ROM: full    Dental  (+) Teeth Intact   Pulmonary neg pulmonary ROS, former smoker,    Pulmonary exam normal        Cardiovascular Exercise Tolerance: Good hypertension, On Medications negative cardio ROS Normal cardiovascular exam Rhythm:regular Rate:Normal     Neuro/Psych PSYCHIATRIC DISORDERS Depression negative neurological ROS     GI/Hepatic negative GI ROS, Neg liver ROS,   Endo/Other  negative endocrine ROSdiabetes, Well Controlled, Type 2, Oral Hypoglycemic Agents  Renal/GU negative Renal ROS  negative genitourinary   Musculoskeletal   Abdominal   Peds  Hematology negative hematology ROS (+)   Anesthesia Other Findings Past Medical History: No date: Arthritis No date: Depression No date: Diabetes ([place]) No date: High cholesterol No date: Hypertension No date: Postmenopausal     Comment:  LMP 2012 Past Surgical History: 09/10/2019: TOTAL HIP ARTHROPLASTY; Left     Comment:  Procedure: TOTAL HIP ARTHROPLASTY ANTERIOR APPROACH;                Surgeon: Hessie Knows, MD;  Location: [facility] ORS;                Service: Orthopedics;  Laterality: Left; No date: TUBAL LIGATION BMI    Body Mass Index: 28.57 kg/m     Reproductive/Obstetrics negative OB ROS                             Anesthesia Physical Anesthesia Plan  ASA: II  Anesthesia Plan: General ETT   Post-op Pain Management:    Induction:   PONV Risk Score and Plan: 4 or greater  Airway Management Planned:   Additional Equipment:   Intra-op Plan:   Post-operative Plan:   Informed Consent: I have reviewed the patients  History and Physical, chart, labs and discussed the procedure including the risks, benefits and alternatives for the proposed anesthesia with the patient or authorized representative who has indicated his/her understanding and acceptance.     Dental Advisory Given  Plan Discussed with: CRNA  Anesthesia Plan Comments:         Anesthesia Quick Evaluation

## 2019-12-12 NOTE — Transfer of Care (Signed)
Immediate Anesthesia Transfer of Care Note  Patient: Jamie [name]  Procedure(s) Performed: RIGHT TOTAL HIP ARTHROPLASTY ANTERIOR APPROACH (Right Hip)  Patient Location: PACU  Anesthesia Type:General  Level of Consciousness: awake, alert  and oriented  Airway & Oxygen Therapy: Patient Spontanous Breathing and Patient connected to face mask oxygen  Post-op Assessment: Report given to RN and Post -op Vital signs reviewed and stable  Post vital signs: Reviewed and stable  Last Vitals:  Vitals Value Taken Time  BP 112/56 12/12/19 0907  Temp    Pulse 75 12/12/19 0909  Resp 14 12/12/19 0909  SpO2 100 % 12/12/19 0909  Vitals shown include unvalidated device data.  Last Pain:  Vitals:   12/12/19 0613  TempSrc: Tympanic  PainSc: 0-No pain         Complications: No apparent anesthesia complications

## 2019-12-13 LAB — SURGICAL PATHOLOGY

## 2020-09-30 ENCOUNTER — Other Ambulatory Visit: Payer: Self-pay | Admitting: Family Medicine

## 2020-09-30 DIAGNOSIS — R945 Abnormal results of liver function studies: Secondary | ICD-10-CM

## 2020-10-08 ENCOUNTER — Ambulatory Visit: Payer: BLUE CROSS/BLUE SHIELD | Attending: Family Medicine

## 2020-12-01 ENCOUNTER — Ambulatory Visit (INDEPENDENT_AMBULATORY_CARE_PROVIDER_SITE_OTHER): Payer: 59 | Admitting: Obstetrics and Gynecology

## 2020-12-01 ENCOUNTER — Other Ambulatory Visit: Payer: Self-pay

## 2020-12-01 ENCOUNTER — Encounter: Payer: Self-pay | Admitting: Obstetrics and Gynecology

## 2020-12-01 ENCOUNTER — Inpatient Hospital Stay (HOSPITAL_COMMUNITY): Admit: 2020-12-01 | Payer: BLUE CROSS/BLUE SHIELD

## 2020-12-01 VITALS — BP 133/79 | HR 82 | Ht 67.0 in | Wt 188.6 lb

## 2020-12-01 DIAGNOSIS — N86 Erosion and ectropion of cervix uteri: Secondary | ICD-10-CM

## 2020-12-01 DIAGNOSIS — Z124 Encounter for screening for malignant neoplasm of cervix: Secondary | ICD-10-CM | POA: Diagnosis not present

## 2020-12-01 DIAGNOSIS — N950 Postmenopausal bleeding: Secondary | ICD-10-CM | POA: Diagnosis not present

## 2020-12-01 DIAGNOSIS — N882 Stricture and stenosis of cervix uteri: Secondary | ICD-10-CM | POA: Diagnosis not present

## 2020-12-01 NOTE — Progress Notes (Addendum)
HPI:      [name] is a 60 y.o. No obstetric history on file. who LMP was No LMP recorded. Patient is postmenopausal.  Subjective:   She presents today because she went for a Pap smear and in the time of Pap smear her cervix began bleeding.  A Pap smear was not performed at that time.  The physician became concerned regarding this postmenopausal bleeding. Patient reports she went into menopause approximately 5 years ago.  She does not have cycles or bleeding.  She is not sexually active. Previously had a tubal ligation for birth control. Patient does state that she had some abnormal Pap smears in the past and had positive HPV.  She said she had some type of "scraping" but did not require any treatment.  (? LEEP)    Hx: The following portions of the patient's history were reviewed and updated as appropriate:             She  has a past medical history of Arthritis, Depression, Diabetes ([place]), High cholesterol, Hypertension, and Postmenopausal. She does not have any pertinent problems on file. She  has a past surgical history that includes Tubal ligation; Total hip arthroplasty (Left, 09/10/2019); and Total hip arthroplasty (Right, 12/12/2019). Her family history includes Bone cancer (age of onset: 52) in her brother; Fibroids (age of onset: 36) in her sister; Lung cancer (age of onset: 53) in her brother; Uterine cancer (age of onset: 30) in her sister; Uterine cancer (age of onset: 38) in her mother. She  reports that she has quit smoking. Her smoking use included cigarettes. She has never used smokeless tobacco. She reports that she does not drink alcohol and does not use drugs. She has a current medication list which includes the following prescription(s): atorvastatin, cholecalciferol, docusate sodium, lisinopril, metformin, paroxetine, enoxaparin, hydrocodone-acetaminophen, and methocarbamol. She is allergic to clindamycin/lincomycin and penicillin g.       Review of Systems:   Review of Systems  Constitutional: Denied constitutional symptoms, night sweats, recent illness, fatigue, fever, insomnia and weight loss.  Eyes: Denied eye symptoms, eye pain, photophobia, vision change and visual disturbance.  Ears/Nose/Throat/Neck: Denied ear, nose, throat or neck symptoms, hearing loss, nasal discharge, sinus congestion and sore throat.  Cardiovascular: Denied cardiovascular symptoms, arrhythmia, chest pain/pressure, edema, exercise intolerance, orthopnea and palpitations.  Respiratory: Denied pulmonary symptoms, asthma, pleuritic pain, productive sputum, cough, dyspnea and wheezing.  Gastrointestinal: Denied, gastro-esophageal reflux, melena, nausea and vomiting.  Genitourinary: See HPI for additional information.  Musculoskeletal: Denied musculoskeletal symptoms, stiffness, swelling, muscle weakness and myalgia.  Dermatologic: Denied dermatology symptoms, rash and scar.  Neurologic: Denied neurology symptoms, dizziness, headache, neck pain and syncope.  Psychiatric: Denied psychiatric symptoms, anxiety and depression.  Endocrine: Denied endocrine symptoms including hot flashes and night sweats.   Meds:   Current Outpatient Medications on File Prior to Visit  Medication Sig Dispense Refill  . atorvastatin (LIPITOR) 40 MG tablet Take 40 mg by mouth every evening.   10  . cholecalciferol (VITAMIN D3) 25 MCG (1000 UNIT) tablet Take 1,000 Units by mouth daily.    Marland Kitchen docusate sodium (COLACE) 100 MG capsule Take 1 capsule (100 mg total) by mouth 2 (two) times daily. 60 capsule 0  . lisinopril (ZESTRIL) 10 MG tablet Take 10 mg by mouth daily.    . metFORMIN (GLUCOPHAGE) 1000 MG tablet Take 1,000 mg by mouth every evening.   4  . PARoxetine (PAXIL) 10 MG tablet Take 10 mg by mouth every  evening.     . enoxaparin (LOVENOX) 40 MG/0.4ML injection Inject 0.4 mLs (40 mg total) into the skin daily for 10 days. 4 mL 0  . HYDROcodone-acetaminophen (NORCO) 5-325 MG tablet Take 1-2  tablets by mouth every 6 (six) hours as needed for moderate pain. (Patient not taking: Reported on 12/01/2020) 40 tablet 0  . methocarbamol (ROBAXIN) 500 MG tablet Take 1 tablet (500 mg total) by mouth 4 (four) times daily. (Patient not taking: Reported on 12/01/2020) 50 tablet 1   No current facility-administered medications on file prior to visit.          Objective:     Vitals:   12/01/20 0743  BP: 133/79  Pulse: 82   Filed Weights   12/01/20 0743  Weight: 188 lb 9.6 oz (85.5 kg)              Physical examination   Pelvic:   Vulva: Normal appearance.  No lesions.  Vagina: No lesions or abnormalities noted.  Support:  Second to third-degree cystocele  Urethra No masses tenderness or scarring.  Meatus Normal size without lesions or prolapse.  Cervix:  Very friable/fragile.  Cervical os not visible-stenosis.  Anus: Normal exam.  No lesions.  Perineum: Normal exam.  No lesions.        Bimanual   Uterus: Normal size.  Non-tender.  Mobile.  AV.  Adnexae: No masses.  Non-tender to palpation.  Cul-de-sac: Negative for abnormality.   Pap smear performed-Silver nitrate applied to friable area  Assessment:    No obstetric history on file. Patient Active Problem List   Diagnosis Date Noted  . 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 Postmenopausal bleeding   2. Cervical ulceration   3. Cervical stenosis (uterine cervix)        Plan:            1.  Pap smear performed  2.  Will contact patient with Pap smear results and decide management at that time. Orders No orders of the defined types were placed in this encounter.   No orders of the defined types were placed in this encounter.     F/U  Return for We will contact her with any abnormal test results. I spent 32 minutes involved in the care of this patient preparing to see the patient by obtaining and reviewing her medical history (including labs, imaging tests and prior  procedures), documenting clinical information in the electronic health record (EHR), counseling and coordinating care plans, writing and sending prescriptions, ordering tests or procedures and directly communicating with the patient by discussing pertinent items from her history and physical exam as well as detailing my assessment and plan as noted above so that she has an informed understanding.  All of her questions were answered.  Finis Bud, M.D. 12/01/2020 8:07 AM

## 2020-12-01 NOTE — Addendum Note (Signed)
Addended by: Durwin Glaze on: 12/01/2020 09:35 AM   Modules accepted: Orders

## 2020-12-02 ENCOUNTER — Encounter: Payer: Self-pay | Admitting: Obstetrics and Gynecology

## 2020-12-04 LAB — CYTOLOGY - PAP
Comment: NEGATIVE
Diagnosis: NEGATIVE
Diagnosis: REACTIVE
High risk HPV: NEGATIVE

## 2022-01-28 ENCOUNTER — Other Ambulatory Visit: Payer: Self-pay | Admitting: Gastroenterology

## 2022-01-28 DIAGNOSIS — R748 Abnormal levels of other serum enzymes: Secondary | ICD-10-CM

## 2022-01-28 DIAGNOSIS — M359 Systemic involvement of connective tissue, unspecified: Secondary | ICD-10-CM

## 2022-02-04 ENCOUNTER — Ambulatory Visit
Admission: RE | Admit: 2022-02-04 | Discharge: 2022-02-04 | Disposition: A | Discharge status: Home / Self Care | Payer: 59 | Source: Ambulatory Visit | Attending: Gastroenterology | Admitting: Gastroenterology

## 2022-02-04 ENCOUNTER — Other Ambulatory Visit: Payer: Self-pay

## 2022-02-04 DIAGNOSIS — R748 Abnormal levels of other serum enzymes: Secondary | ICD-10-CM | POA: Insufficient documentation

## 2022-02-04 DIAGNOSIS — M359 Systemic involvement of connective tissue, unspecified: Secondary | ICD-10-CM | POA: Diagnosis present

## 2022-03-11 ENCOUNTER — Other Ambulatory Visit: Payer: Self-pay | Admitting: Family Medicine

## 2022-03-11 DIAGNOSIS — Z1231 Encounter for screening mammogram for malignant neoplasm of breast: Secondary | ICD-10-CM

## 2022-03-18 ENCOUNTER — Other Ambulatory Visit: Payer: Self-pay | Admitting: Gastroenterology

## 2022-03-18 DIAGNOSIS — R7989 Other specified abnormal findings of blood chemistry: Secondary | ICD-10-CM

## 2022-03-21 ENCOUNTER — Other Ambulatory Visit: Payer: Self-pay | Admitting: *Deleted

## 2022-04-05 ENCOUNTER — Ambulatory Visit: Payer: 59

## 2022-04-06 ENCOUNTER — Other Ambulatory Visit: Payer: Self-pay | Admitting: Radiology

## 2022-04-06 DIAGNOSIS — R7989 Other specified abnormal findings of blood chemistry: Secondary | ICD-10-CM

## 2022-04-06 NOTE — H&P (Signed)
Chief Complaint: Patient was seen in consultation today for non-focal liver biopsy   Referring Physician(s): Croley,[place] M  Supervising Physician: Juliet Rude  Patient Status: [facility] - Out-pt  History of Present Illness: Jamie [name] is a 61 y.o. female with a medical history significant for diabetes and hypertension. Recent lab work shows elevated liver enzymes and imaging was negative for significant abnormal findings.   Interventional Radiology has been asked to evaluate this patient for an image-guided non-focal liver biopsy for further work up.   Past Medical History:  Diagnosis Date   Arthritis    Depression    Diabetes ([place])    High cholesterol    Hypertension    Postmenopausal    LMP 2012    Past Surgical History:  Procedure Laterality Date   TOTAL HIP ARTHROPLASTY Left 09/10/2019   Procedure: TOTAL HIP ARTHROPLASTY ANTERIOR APPROACH;  Surgeon: Hessie Knows, MD;  Location: [facility] ORS;  Service: Orthopedics;  Laterality: Left;   TOTAL HIP ARTHROPLASTY Right 12/12/2019   Procedure: RIGHT TOTAL HIP ARTHROPLASTY ANTERIOR APPROACH;  Surgeon: Hessie Knows, MD;  Location: [facility] ORS;  Service: Orthopedics;  Laterality: Right;   TUBAL LIGATION      Allergies: Clindamycin/lincomycin and Penicillin g  Medications: Prior to Admission medications   Medication Sig Start Date End Date Taking? Authorizing Provider  atorvastatin (LIPITOR) 40 MG tablet Take 40 mg by mouth every evening.  04/21/16   [provider]  cholecalciferol (VITAMIN D3) 25 MCG (1000 UNIT) tablet Take 1,000 Units by mouth daily.    [provider]  docusate sodium (COLACE) 100 MG capsule Take 1 capsule (100 mg total) by mouth 2 (two) times daily. 12/12/19   Hessie Knows, MD  enoxaparin (LOVENOX) 40 MG/0.4ML injection Inject 0.4 mLs (40 mg total) into the skin daily for 10 days. 12/12/19 12/22/19  Hessie Knows, MD  HYDROcodone-acetaminophen (NORCO) 5-325 MG tablet Take 1-2  tablets by mouth every 6 (six) hours as needed for moderate pain. Patient not taking: Reported on 12/01/2020 12/12/19   Hessie Knows, MD  lisinopril (ZESTRIL) 10 MG tablet Take 10 mg by mouth daily.    [provider]  metFORMIN (GLUCOPHAGE) 1000 MG tablet Take 1,000 mg by mouth every evening.  04/21/16   [provider]  methocarbamol (ROBAXIN) 500 MG tablet Take 1 tablet (500 mg total) by mouth 4 (four) times daily. Patient not taking: Reported on 12/01/2020 12/12/19   Hessie Knows, MD  PARoxetine (PAXIL) 10 MG tablet Take 10 mg by mouth every evening.     [provider]     Family History  Problem Relation Age of Onset   Fibroids Sister 21   Uterine cancer Sister 72   Uterine cancer Mother 53   Lung cancer Brother 30   Bone cancer Brother 56   Breast cancer Neg Hx     Social History   Socioeconomic History   Marital status: Married    Spouse name: Not on file   Number of children: Not on file   Years of education: Not on file   Highest education level: Not on file  Occupational History   Not on file  Tobacco Use   Smoking status: Former    Types: Cigarettes   Smokeless tobacco: Never  Vaping Use   Vaping Use: Never used  Substance and Sexual Activity   Alcohol use: Never   Drug use: Never   Sexual activity: Not on file  Other Topics Concern   Not on  file  Social History Narrative   Not on file   Social Determinants of Health   Financial Resource Strain: Not on file  Food Insecurity: Not on file  Transportation Needs: Not on file  Physical Activity: Not on file  Stress: Not on file  Social Connections: Not on file    Review of Systems: A 12 point ROS discussed and pertinent positives are indicated in the HPI above.  All other systems are negative.  Review of Systems  Constitutional:  Negative for appetite change and fatigue.  Respiratory:  Negative for cough and shortness of breath.   Cardiovascular:  Negative for chest pain and leg  swelling.  Gastrointestinal:  Negative for abdominal pain, diarrhea, nausea and vomiting.  Musculoskeletal:  Negative for back pain.  Neurological:  Negative for dizziness and headaches.   Vital Signs: Pulse 75   Temp 97.7 F (36.5 C) (Oral)   Resp 18   Ht '5\' 6"'$  (1.676 m)   Wt 187 lb (84.8 kg)   BMI 30.18 kg/m   Physical Exam Constitutional:      General: She is not in acute distress.    Appearance: She is not ill-appearing.  HENT:     Mouth/Throat:     Mouth: Mucous membranes are moist.     Pharynx: Oropharynx is clear.  Cardiovascular:     Rate and Rhythm: Normal rate and regular rhythm.     Pulses: Normal pulses.     Heart sounds: Normal heart sounds.  Pulmonary:     Effort: Pulmonary effort is normal.     Breath sounds: Normal breath sounds.  Abdominal:     General: Bowel sounds are normal.     Palpations: Abdomen is soft.     Tenderness: There is no abdominal tenderness.  Musculoskeletal:        General: Normal range of motion.  Skin:    General: Skin is warm and dry.  Neurological:     Mental Status: She is alert and oriented to person, place, and time.    Imaging: No results found.  Labs:  CBC: Recent Labs    04/07/22 0739  WBC 5.1  HGB 11.5*  HCT 35.4*  PLT 226    COAGS: Recent Labs    04/07/22 0739  INR 1.1    BMP: No results for input(s): NA, K, CL, CO2, GLUCOSE, BUN, CALCIUM, CREATININE, GFRNONAA, GFRAA in the last 8760 hours.  Invalid input(s): CMP  LIVER FUNCTION TESTS: No results for input(s): BILITOT, AST, ALT, ALKPHOS, PROT, ALBUMIN in the last 8760 hours.  TUMOR MARKERS: No results for input(s): AFPTM, CEA, CA199, CHROMGRNA in the last 8760 hours.  Assessment and Plan:  Elevated liver enzymes: Jamie [name], 61 year old female, presents today to the [facility] Interventional Radiology department for an image-guided non-focal liver biopsy.  Risks and benefits of this procedure were discussed with  the patient and/or patient's family including, but not limited to bleeding, infection, damage to adjacent structures or low yield requiring additional tests.  All of the questions were answered and there is agreement to proceed. She has been NPO. She does not take any blood-thinning medications.   Consent signed and in chart.  Thank you for this interesting consult.  I greatly enjoyed meeting CLARITZA JULY and look forward to participating in their care.  A copy of this report was sent to the requesting provider on this date.  Electronically Signed: Soyla Dryer, AGACNP-BC [phone] 04/07/2022, 8:15 AM   I spent  a total of  30 Minutes   in face to face in clinical consultation, greater than 50% of which was counseling/coordinating care for non-focal liver biopsy

## 2022-04-07 ENCOUNTER — Other Ambulatory Visit: Payer: Self-pay

## 2022-04-07 ENCOUNTER — Ambulatory Visit
Admission: RE | Admit: 2022-04-07 | Discharge: 2022-04-07 | Disposition: A | Discharge status: Home / Self Care | Payer: 59 | Source: Ambulatory Visit | Attending: Gastroenterology | Admitting: Gastroenterology

## 2022-04-07 DIAGNOSIS — K7400 Hepatic fibrosis, unspecified: Secondary | ICD-10-CM | POA: Insufficient documentation

## 2022-04-07 DIAGNOSIS — I10 Essential (primary) hypertension: Secondary | ICD-10-CM | POA: Insufficient documentation

## 2022-04-07 DIAGNOSIS — E119 Type 2 diabetes mellitus without complications: Secondary | ICD-10-CM | POA: Insufficient documentation

## 2022-04-07 DIAGNOSIS — R7989 Other specified abnormal findings of blood chemistry: Secondary | ICD-10-CM | POA: Diagnosis present

## 2022-04-07 DIAGNOSIS — K739 Chronic hepatitis, unspecified: Secondary | ICD-10-CM | POA: Diagnosis not present

## 2022-04-07 LAB — CBC
HCT: 35.4 % — ABNORMAL LOW (ref 36.0–46.0)
Hemoglobin: 11.5 g/dL — ABNORMAL LOW (ref 12.0–15.0)
MCH: 27.8 pg (ref 26.0–34.0)
MCHC: 32.5 g/dL (ref 30.0–36.0)
MCV: 85.7 fL (ref 80.0–100.0)
Platelets: 226 10*3/uL (ref 150–400)
RBC: 4.13 MIL/uL (ref 3.87–5.11)
RDW: 13.6 % (ref 11.5–15.5)
WBC: 5.1 10*3/uL (ref 4.0–10.5)
nRBC: 0 % (ref 0.0–0.2)

## 2022-04-07 LAB — GLUCOSE, CAPILLARY: Glucose-Capillary: 97 mg/dL (ref 70–99)

## 2022-04-07 LAB — PROTIME-INR
INR: 1.1 (ref 0.8–1.2)
Prothrombin Time: 13.7 seconds (ref 11.4–15.2)

## 2022-04-07 MED ORDER — FENTANYL CITRATE (PF) 100 MCG/2ML IJ SOLN
INTRAMUSCULAR | Status: AC
  Filled 2022-04-07: qty 2

## 2022-04-07 MED ORDER — FENTANYL CITRATE (PF) 100 MCG/2ML IJ SOLN
INTRAMUSCULAR | Status: AC | PRN
  Administered 2022-04-07 (×2): 50 ug via INTRAVENOUS

## 2022-04-07 MED ORDER — MIDAZOLAM HCL 2 MG/2ML IJ SOLN
INTRAMUSCULAR | Status: AC
  Filled 2022-04-07: qty 4

## 2022-04-07 MED ORDER — SODIUM CHLORIDE 0.9 % IV SOLN: INTRAVENOUS | Status: DC

## 2022-04-07 MED ORDER — MIDAZOLAM HCL 2 MG/2ML IJ SOLN
INTRAMUSCULAR | Status: AC | PRN
  Administered 2022-04-07 (×2): 1 mg via INTRAVENOUS

## 2022-04-07 NOTE — Procedures (Signed)
Interventional Radiology Procedure Note  Date of Procedure: 04/07/2022  Procedure: Random liver biopsy   Findings:  1. [place] randopm liver biopsy right liver lobe 18ga x2 cores    Complications: No immediate complications noted.   Estimated Blood Loss: minimal  Follow-up and Recommendations: 1. Bedrest 2 hours    Albin Felling, MD  Vascular & Interventional Radiology  04/07/2022 8:54 AM

## 2022-04-15 ENCOUNTER — Ambulatory Visit
Admission: RE | Admit: 2022-04-15 | Discharge: 2022-04-15 | Disposition: A | Discharge status: Home / Self Care | Payer: 59 | Source: Ambulatory Visit | Attending: Family Medicine | Admitting: Family Medicine

## 2022-04-15 DIAGNOSIS — Z1231 Encounter for screening mammogram for malignant neoplasm of breast: Secondary | ICD-10-CM | POA: Insufficient documentation

## 2022-04-15 LAB — SURGICAL PATHOLOGY

## 2022-11-11 ENCOUNTER — Encounter: Payer: Self-pay | Admitting: Internal Medicine

## 2022-11-16 ENCOUNTER — Ambulatory Visit
Admission: RE | Admit: 2022-11-16 | Discharge: 2022-11-16 | Disposition: A | Discharge status: Home / Self Care | Payer: 59 | Source: Ambulatory Visit | Attending: Internal Medicine | Admitting: Internal Medicine

## 2022-11-16 ENCOUNTER — Ambulatory Visit: Payer: 59 | Admitting: Registered Nurse

## 2022-11-16 ENCOUNTER — Encounter
Admission: RE | Disposition: A | Discharge status: Home / Self Care | Payer: Self-pay | Source: Ambulatory Visit | Attending: Internal Medicine

## 2022-11-16 ENCOUNTER — Encounter: Payer: Self-pay | Admitting: Internal Medicine

## 2022-11-16 DIAGNOSIS — E119 Type 2 diabetes mellitus without complications: Secondary | ICD-10-CM | POA: Diagnosis not present

## 2022-11-16 DIAGNOSIS — D123 Benign neoplasm of transverse colon: Secondary | ICD-10-CM | POA: Insufficient documentation

## 2022-11-16 DIAGNOSIS — F32A Depression, unspecified: Secondary | ICD-10-CM | POA: Insufficient documentation

## 2022-11-16 DIAGNOSIS — K754 Autoimmune hepatitis: Secondary | ICD-10-CM | POA: Diagnosis not present

## 2022-11-16 DIAGNOSIS — Z7984 Long term (current) use of oral hypoglycemic drugs: Secondary | ICD-10-CM | POA: Insufficient documentation

## 2022-11-16 DIAGNOSIS — Z1211 Encounter for screening for malignant neoplasm of colon: Secondary | ICD-10-CM | POA: Diagnosis present

## 2022-11-16 DIAGNOSIS — I10 Essential (primary) hypertension: Secondary | ICD-10-CM | POA: Insufficient documentation

## 2022-11-16 DIAGNOSIS — E7800 Pure hypercholesterolemia, unspecified: Secondary | ICD-10-CM | POA: Diagnosis not present

## 2022-11-16 HISTORY — PX: COLONOSCOPY WITH PROPOFOL: SHX5780

## 2022-11-16 LAB — GLUCOSE, CAPILLARY: Glucose-Capillary: 98 mg/dL (ref 70–99)

## 2022-11-16 SURGERY — COLONOSCOPY WITH PROPOFOL
Anesthesia: General

## 2022-11-16 MED ORDER — LIDOCAINE HCL (CARDIAC) PF 100 MG/5ML IV SOSY
PREFILLED_SYRINGE | INTRAVENOUS | Status: DC | PRN
  Administered 2022-11-16: 100 mg via INTRAVENOUS

## 2022-11-16 MED ORDER — SODIUM CHLORIDE 0.9 % IV SOLN: INTRAVENOUS | Status: DC

## 2022-11-16 MED ORDER — PROPOFOL 10 MG/ML IV BOLUS
INTRAVENOUS | Status: DC | PRN
  Administered 2022-11-16: 10 mg via INTRAVENOUS
  Administered 2022-11-16: 90 mg via INTRAVENOUS

## 2022-11-16 MED ORDER — PROPOFOL 500 MG/50ML IV EMUL
INTRAVENOUS | Status: DC | PRN
  Administered 2022-11-16: 167.038 ug/kg/min via INTRAVENOUS

## 2022-11-16 MED ORDER — STERILE WATER FOR IRRIGATION IR SOLN
Status: DC | PRN
  Administered 2022-11-16: 60 mL

## 2022-11-16 NOTE — Anesthesia Preprocedure Evaluation (Signed)
Anesthesia Evaluation  Patient identified by MRN, date of birth, ID band Patient awake    Reviewed: Allergy & Precautions, NPO status , Patient's Chart, lab work & pertinent test results  History of Anesthesia Complications Negative for: history of anesthetic complications  Airway Mallampati: III  TM Distance: >3 FB Neck ROM: full    Dental  (+) Dental Advidsory Given   Pulmonary neg pulmonary ROS, neg COPD, former smoker   Pulmonary exam normal        Cardiovascular hypertension, (-) Past MI and (-) CABG negative cardio ROS Normal cardiovascular exam     Neuro/Psych  PSYCHIATRIC DISORDERS      negative neurological ROS     GI/Hepatic negative GI ROS, Neg liver ROS,,,  Endo/Other  negative endocrine ROSdiabetes    Renal/GU negative Renal ROS  negative genitourinary   Musculoskeletal   Abdominal   Peds  Hematology negative hematology ROS (+)   Anesthesia Other Findings Past Medical History: No date: Arthritis No date: Depression No date: Diabetes ([place]) No date: High cholesterol No date: Hypertension No date: Postmenopausal     Comment:  LMP 2012  Past Surgical History: No date: JOINT REPLACEMENT 09/10/2019: TOTAL HIP ARTHROPLASTY; Left     Comment:  Procedure: TOTAL HIP ARTHROPLASTY ANTERIOR APPROACH;                Surgeon: Hessie Knows, MD;  Location: [facility] ORS;                Service: Orthopedics;  Laterality: Left; 12/12/2019: TOTAL HIP ARTHROPLASTY; Right     Comment:  Procedure: RIGHT TOTAL HIP ARTHROPLASTY ANTERIOR               APPROACH;  Surgeon: Hessie Knows, MD;  Location: [facility]               ORS;  Service: Orthopedics;  Laterality: Right; No date: TUBAL LIGATION  BMI    Body Mass Index: 31.01 kg/m      Reproductive/Obstetrics negative OB ROS                             Anesthesia Physical Anesthesia Plan  ASA: 2  Anesthesia Plan: General   Post-op Pain  Management: Minimal or no pain anticipated   Induction: Intravenous  PONV Risk Score and Plan: 3 and Propofol infusion, TIVA and Ondansetron  Airway Management Planned: Nasal Cannula  Additional Equipment: None  Intra-op Plan:   Post-operative Plan:   Informed Consent: I have reviewed the patients History and Physical, chart, labs and discussed the procedure including the risks, benefits and alternatives for the proposed anesthesia with the patient or authorized representative who has indicated his/her understanding and acceptance.     Dental advisory given  Plan Discussed with: CRNA and Surgeon  Anesthesia Plan Comments: (Discussed risks of anesthesia with patient, including possibility of difficulty with spontaneous ventilation under anesthesia necessitating airway intervention, PONV, and rare risks such as cardiac or respiratory or neurological events, and allergic reactions. Discussed the role of CRNA in patient's perioperative care. Patient understands.)       Anesthesia Quick Evaluation

## 2022-11-16 NOTE — H&P (Signed)
  Outpatient short stay form Pre-procedure 11/16/2022 11:01 AM Jamie Searson K. Alice Reichert, M.D.  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 Patient denies change in bowel habits, rectal bleeding, weight loss or abdominal pain.     No current facility-administered medications for this encounter.  Medications Prior to Admission  Medication Sig Dispense Refill Last Dose   atorvastatin (LIPITOR) 40 MG tablet Take 40 mg by mouth every evening.   10    cholecalciferol (VITAMIN D3) 25 MCG (1000 UNIT) tablet Take 1,000 Units by mouth daily.      docusate sodium (COLACE) 100 MG capsule Take 1 capsule (100 mg total) by mouth 2 (two) times daily. (Patient not taking: Reported on 04/07/2022) 60 capsule 0    enoxaparin (LOVENOX) 40 MG/0.4ML injection Inject 0.4 mLs (40 mg total) into the skin daily for 10 days. 4 mL 0    HYDROcodone-acetaminophen (NORCO) 5-325 MG tablet Take 1-2 tablets by mouth every 6 (six) hours as needed for moderate pain. (Patient not taking: Reported on 12/01/2020) 40 tablet 0    lisinopril (ZESTRIL) 10 MG tablet Take 10 mg by mouth daily.      metFORMIN (GLUCOPHAGE) 1000 MG tablet Take 1,000 mg by mouth every evening.   4    methocarbamol (ROBAXIN) 500 MG tablet Take 1 tablet (500 mg total) by mouth 4 (four) times daily. (Patient not taking: Reported on 12/01/2020) 50 tablet 1    PARoxetine (PAXIL) 10 MG tablet Take 10 mg by mouth every evening.         Allergies  Allergen Reactions   Clindamycin/Lincomycin Rash   Penicillin G Rash    Did it involve swelling of the face/tongue/throat, SOB, or low BP? No Did it involve sudden or severe rash/hives, skin peeling, or any reaction on the inside of your mouth or nose? Yes Did you need to seek medical attention at a hospital or doctor's office? Yes When did it last  happen?      when she was younger If all above answers are "NO", may proceed with cephalosporin use.      Past Medical History:  Diagnosis Date   Arthritis    Depression    Diabetes ([place])    High cholesterol    Hypertension    Postmenopausal    LMP 2012    Review of systems:  Otherwise negative.    Physical Exam  Gen: Alert, oriented. Appears stated age.  HEENT: [place]/AT. PERRLA. Lungs: CTA, no wheezes. CV: RR nl S1, S2. Abd: soft, benign, no masses. BS+ Ext: No edema. Pulses 2+    Planned procedures: Proceed with colonoscopy. The patient understands the nature of the planned procedure, indications, risks, alternatives and potential complications including but not limited to bleeding, infection, perforation, damage to internal organs and possible oversedation/side effects from anesthesia. The patient agrees and gives consent to proceed.  Please refer to procedure notes for findings, recommendations and patient disposition/instructions.     Gabrella Stroh K. Alice Reichert, M.D. Gastroenterology 11/16/2022  11:01 AM

## 2022-11-16 NOTE — Op Note (Signed)
[facility] Gastroenterology Patient Name: Jamie [name] Procedure Date: 11/16/2022 1:07 PM MRN: [id number] Account #: [id number] Date of Birth: 1961-08-24 Admit Type: Outpatient Age: 61 Room: [facility] ENDO ROOM 2 Gender: Female Note Status: Finalized Instrument Name: Jasper Riling [id number] Procedure:             Colonoscopy Indications:           Screening for colorectal malignant neoplasm Providers:             Benay Pike. Alice Reichert MD, MD Referring MD:          Dionicia Abler (Referring MD) Medicines:             Propofol per Anesthesia Complications:         No immediate complications. Procedure:             Pre-Anesthesia Assessment:                        - The risks and benefits of the procedure and the                         sedation options and risks were discussed with the                         patient. All questions were answered and informed                         consent was obtained.                        - Patient identification and proposed procedure were                         verified prior to the procedure by the nurse. The                         procedure was verified in the procedure room.                        - ASA Grade Assessment: III - A patient with severe                         systemic disease.                        - After reviewing the risks and benefits, the patient                         was deemed in satisfactory condition to undergo the                         procedure.                        After obtaining informed consent, the colonoscope was                         passed under direct vision. Throughout the procedure,                         the patient's  blood pressure, pulse, and oxygen                         saturations were monitored continuously. The                         Colonoscope was introduced through the anus and                         advanced to the the cecum, identified by appendiceal                          orifice and ileocecal valve. The colonoscopy was                         performed without difficulty. The patient tolerated                         the procedure well. The quality of the bowel                         preparation was adequate. The ileocecal valve,                         appendiceal orifice, and rectum were photographed. Findings:      The perianal and digital rectal examinations were normal. Pertinent       negatives include normal sphincter tone and no palpable rectal lesions.      A 6 mm polyp was found in the transverse colon. The polyp was sessile.       The polyp was removed with a piecemeal technique using a cold biopsy       forceps. Resection and retrieval were complete.      The exam was otherwise without abnormality on direct and retroflexion       views. Impression:            - One 6 mm polyp in the transverse colon, removed                         piecemeal using a cold biopsy forceps. Resected and                         retrieved.                        - The examination was otherwise normal on direct and                         retroflexion views. Recommendation:        - Patient has a contact number available for                         emergencies. The signs and symptoms of potential                         delayed complications were discussed with the patient.                         Return to normal activities tomorrow. Written  discharge instructions were provided to the patient.                        - Resume previous diet.                        - Continue present medications.                        - Repeat colonoscopy is recommended for surveillance.                         The colonoscopy date will be determined after                         pathology results from today's exam become available                         for review.                        - Return to GI office PRN.                        - The  findings and recommendations were discussed with                         the patient. Procedure Code(s):     --- Professional ---                        [phone], Colonoscopy, flexible; with biopsy, single or                         multiple Diagnosis Code(s):     --- Professional ---                        D12.3, Benign neoplasm of transverse colon (hepatic                         flexure or splenic flexure)                        Z12.11, Encounter for screening for malignant neoplasm                         of colon CPT copyright 2022 American Medical Association. All rights reserved. The codes documented in this report are preliminary and upon coder review may  be revised to meet current compliance requirements. Efrain Sella MD, MD 11/16/2022 1:37:08 PM This report has been signed electronically. Number of Addenda: 0 Note Initiated On: 11/16/2022 1:07 PM Scope Withdrawal Time: 0 hours 9 minutes 23 seconds  Total Procedure Duration: 0 hours 14 minutes 6 seconds  Estimated Blood Loss:  Estimated blood loss: none.      [facility]

## 2022-11-16 NOTE — Transfer of Care (Signed)
Immediate Anesthesia Transfer of Care Note  Patient: Jamie [name]  Procedure(s) Performed: COLONOSCOPY WITH PROPOFOL  Patient Location: Endoscopy Unit  Anesthesia Type:General  Level of Consciousness: drowsy  Airway & Oxygen Therapy: Patient Spontanous Breathing  Post-op Assessment: Report given to RN and Post -op Vital signs reviewed and stable  Post vital signs: Reviewed and stable  Last Vitals:  Vitals Value Taken Time  BP    Temp    Pulse 94 11/16/22 1336  Resp    SpO2 98 % 11/16/22 1336  Vitals shown include unvalidated device data.  Last Pain:  Vitals:   11/16/22 1205  TempSrc: Temporal  PainSc: 0-No pain         Complications: No notable events documented.

## 2022-11-16 NOTE — Anesthesia Postprocedure Evaluation (Signed)
Anesthesia Post Note  Patient: Jamie [name]  Procedure(s) Performed: COLONOSCOPY WITH PROPOFOL  Patient location during evaluation: Endoscopy Anesthesia Type: General Level of consciousness: awake and alert Pain management: pain level controlled Vital Signs Assessment: post-procedure vital signs reviewed and stable Respiratory status: spontaneous breathing, nonlabored ventilation, respiratory function stable and patient connected to nasal cannula oxygen Cardiovascular status: blood pressure returned to baseline and stable Postop Assessment: no apparent nausea or vomiting Anesthetic complications: no  No notable events documented.   Last Vitals:  Vitals:   11/16/22 1347 11/16/22 1357  BP: 138/89   Pulse:  82  Resp:    Temp:    SpO2:  100%    Last Pain:  Vitals:   11/16/22 1357  TempSrc:   PainSc: 0-No pain                 Dimas Millin

## 2022-11-16 NOTE — Interval H&P Note (Signed)
History and Physical Interval Note:  11/16/2022 11:03 AM  Jamie [name]  has presented today for surgery, with the diagnosis of Z12.11 - Colon cancer screening.  The various methods of treatment have been discussed with the patient and family. After consideration of risks, benefits and other options for treatment, the patient has consented to  Procedure(s): COLONOSCOPY WITH PROPOFOL (N/A) as a surgical intervention.  The patient's history has been reviewed, patient examined, no change in status, stable for surgery.  I have reviewed the patient's chart and labs.  Questions were answered to the patient's satisfaction.     [place], [place]

## 2022-11-17 ENCOUNTER — Encounter: Payer: Self-pay | Admitting: Internal Medicine

## 2022-11-17 LAB — SURGICAL PATHOLOGY

## 2023-03-17 ENCOUNTER — Other Ambulatory Visit: Payer: Self-pay

## 2023-03-17 DIAGNOSIS — Z1231 Encounter for screening mammogram for malignant neoplasm of breast: Secondary | ICD-10-CM

## 2023-05-05 ENCOUNTER — Ambulatory Visit
Admission: RE | Admit: 2023-05-05 | Discharge: 2023-05-05 | Disposition: A | Discharge status: Home / Self Care | Payer: Medicaid Other | Source: Ambulatory Visit | Attending: Family Medicine | Admitting: Family Medicine

## 2023-05-05 DIAGNOSIS — Z1231 Encounter for screening mammogram for malignant neoplasm of breast: Secondary | ICD-10-CM | POA: Diagnosis present

## 2023-06-08 ENCOUNTER — Other Ambulatory Visit: Payer: Self-pay

## 2023-06-08 ENCOUNTER — Emergency Department: Payer: Medicaid Other

## 2023-06-08 ENCOUNTER — Encounter: Payer: Self-pay | Admitting: *Deleted

## 2023-06-08 ENCOUNTER — Inpatient Hospital Stay
Admission: EM | Admit: 2023-06-08 | Discharge: 2023-06-12 | DRG: 372 | Disposition: A | Discharge status: Home / Self Care | Payer: Medicaid Other | Attending: Surgery | Admitting: Surgery

## 2023-06-08 DIAGNOSIS — E119 Type 2 diabetes mellitus without complications: Secondary | ICD-10-CM | POA: Diagnosis present

## 2023-06-08 DIAGNOSIS — Z808 Family history of malignant neoplasm of other organs or systems: Secondary | ICD-10-CM | POA: Diagnosis not present

## 2023-06-08 DIAGNOSIS — Z801 Family history of malignant neoplasm of trachea, bronchus and lung: Secondary | ICD-10-CM | POA: Diagnosis not present

## 2023-06-08 DIAGNOSIS — E871 Hypo-osmolality and hyponatremia: Secondary | ICD-10-CM | POA: Diagnosis present

## 2023-06-08 DIAGNOSIS — Z880 Allergy status to penicillin: Secondary | ICD-10-CM | POA: Diagnosis not present

## 2023-06-08 DIAGNOSIS — Z87891 Personal history of nicotine dependence: Secondary | ICD-10-CM | POA: Diagnosis not present

## 2023-06-08 DIAGNOSIS — Z1152 Encounter for screening for COVID-19: Secondary | ICD-10-CM | POA: Diagnosis not present

## 2023-06-08 DIAGNOSIS — Z79899 Other long term (current) drug therapy: Secondary | ICD-10-CM | POA: Diagnosis not present

## 2023-06-08 DIAGNOSIS — I10 Essential (primary) hypertension: Secondary | ICD-10-CM | POA: Diagnosis present

## 2023-06-08 DIAGNOSIS — M1990 Unspecified osteoarthritis, unspecified site: Secondary | ICD-10-CM | POA: Diagnosis present

## 2023-06-08 DIAGNOSIS — K5900 Constipation, unspecified: Secondary | ICD-10-CM | POA: Diagnosis present

## 2023-06-08 DIAGNOSIS — E860 Dehydration: Secondary | ICD-10-CM | POA: Diagnosis present

## 2023-06-08 DIAGNOSIS — R109 Unspecified abdominal pain: Secondary | ICD-10-CM | POA: Diagnosis not present

## 2023-06-08 DIAGNOSIS — E872 Acidosis, unspecified: Secondary | ICD-10-CM | POA: Diagnosis present

## 2023-06-08 DIAGNOSIS — K3532 Acute appendicitis with perforation and localized peritonitis, without abscess: Principal | ICD-10-CM | POA: Diagnosis present

## 2023-06-08 DIAGNOSIS — Z96643 Presence of artificial hip joint, bilateral: Secondary | ICD-10-CM | POA: Diagnosis present

## 2023-06-08 DIAGNOSIS — E7800 Pure hypercholesterolemia, unspecified: Secondary | ICD-10-CM | POA: Diagnosis present

## 2023-06-08 DIAGNOSIS — A419 Sepsis, unspecified organism: Secondary | ICD-10-CM

## 2023-06-08 DIAGNOSIS — E876 Hypokalemia: Secondary | ICD-10-CM | POA: Diagnosis present

## 2023-06-08 DIAGNOSIS — Z8049 Family history of malignant neoplasm of other genital organs: Secondary | ICD-10-CM

## 2023-06-08 DIAGNOSIS — N179 Acute kidney failure, unspecified: Secondary | ICD-10-CM | POA: Diagnosis present

## 2023-06-08 DIAGNOSIS — Z7902 Long term (current) use of antithrombotics/antiplatelets: Secondary | ICD-10-CM | POA: Diagnosis not present

## 2023-06-08 DIAGNOSIS — Z7984 Long term (current) use of oral hypoglycemic drugs: Secondary | ICD-10-CM | POA: Diagnosis not present

## 2023-06-08 DIAGNOSIS — Z881 Allergy status to other antibiotic agents status: Secondary | ICD-10-CM | POA: Diagnosis not present

## 2023-06-08 LAB — URINALYSIS, ROUTINE W REFLEX MICROSCOPIC
Bilirubin Urine: NEGATIVE
Glucose, UA: NEGATIVE mg/dL
Hgb urine dipstick: NEGATIVE
Ketones, ur: 5 mg/dL — AB
Leukocytes,Ua: NEGATIVE
Nitrite: NEGATIVE
Protein, ur: 100 mg/dL — AB
Specific Gravity, Urine: 1.018 (ref 1.005–1.030)
pH: 5 (ref 5.0–8.0)

## 2023-06-08 LAB — HEPATIC FUNCTION PANEL
ALT: 15 U/L (ref 0–44)
AST: 24 U/L (ref 15–41)
Albumin: 3.7 g/dL (ref 3.5–5.0)
Alkaline Phosphatase: 77 U/L (ref 38–126)
Bilirubin, Direct: 0.4 mg/dL — ABNORMAL HIGH (ref 0.0–0.2)
Indirect Bilirubin: 0.7 mg/dL (ref 0.3–0.9)
Total Bilirubin: 1.1 mg/dL (ref 0.3–1.2)
Total Protein: 7.8 g/dL (ref 6.5–8.1)

## 2023-06-08 LAB — RESP PANEL BY RT-PCR (RSV, FLU A&B, COVID)  RVPGX2
Influenza A by PCR: NEGATIVE
Influenza B by PCR: NEGATIVE
Resp Syncytial Virus by PCR: NEGATIVE
SARS Coronavirus 2 by RT PCR: NEGATIVE

## 2023-06-08 LAB — CBG MONITORING, ED: Glucose-Capillary: 107 mg/dL — ABNORMAL HIGH (ref 70–99)

## 2023-06-08 LAB — CBC
HCT: 36.3 % (ref 36.0–46.0)
Hemoglobin: 12.4 g/dL (ref 12.0–15.0)
MCH: 29.7 pg (ref 26.0–34.0)
MCHC: 34.2 g/dL (ref 30.0–36.0)
MCV: 86.8 fL (ref 80.0–100.0)
Platelets: 167 10*3/uL (ref 150–400)
RBC: 4.18 MIL/uL (ref 3.87–5.11)
RDW: 12.5 % (ref 11.5–15.5)
WBC: 6.6 10*3/uL (ref 4.0–10.5)
nRBC: 0 % (ref 0.0–0.2)

## 2023-06-08 LAB — BASIC METABOLIC PANEL
Anion gap: 7 (ref 5–15)
BUN: 37 mg/dL — ABNORMAL HIGH (ref 8–23)
CO2: 22 mmol/L (ref 22–32)
Calcium: 8.5 mg/dL — ABNORMAL LOW (ref 8.9–10.3)
Chloride: 96 mmol/L — ABNORMAL LOW (ref 98–111)
Creatinine, Ser: 1.76 mg/dL — ABNORMAL HIGH (ref 0.44–1.00)
GFR, Estimated: 33 mL/min — ABNORMAL LOW (ref >60)
Glucose, Bld: 140 mg/dL — ABNORMAL HIGH (ref 70–99)
Potassium: 3.4 mmol/L — ABNORMAL LOW (ref 3.5–5.1)
Sodium: 125 mmol/L — ABNORMAL LOW (ref 135–145)

## 2023-06-08 LAB — LIPASE, BLOOD: Lipase: 32 U/L (ref 11–51)

## 2023-06-08 LAB — LACTIC ACID, PLASMA
Lactic Acid, Venous: 2.5 mmol/L (ref 0.5–1.9)
Lactic Acid, Venous: 2.2 mmol/L (ref 0.5–1.9)

## 2023-06-08 LAB — TROPONIN I (HIGH SENSITIVITY): Troponin I (High Sensitivity): 10 ng/L (ref <18)

## 2023-06-08 LAB — GLUCOSE, CAPILLARY: Glucose-Capillary: 121 mg/dL — ABNORMAL HIGH (ref 70–99)

## 2023-06-08 MED ORDER — LACTATED RINGERS IV BOLUS (SEPSIS)
1000.0000 mL | Freq: Once | INTRAVENOUS | Status: AC
  Administered 2023-06-08: 1000 mL via INTRAVENOUS

## 2023-06-08 MED ORDER — KETOROLAC TROMETHAMINE 30 MG/ML IJ SOLN
15.0000 mg | Freq: Four times a day (QID) | INTRAMUSCULAR | Status: DC | PRN
  Administered 2023-06-10 – 2023-06-12 (×3): 15 mg via INTRAVENOUS
  Filled 2023-06-08 (×4): qty 1

## 2023-06-08 MED ORDER — METRONIDAZOLE 500 MG/100ML IV SOLN
500.0000 mg | Freq: Once | INTRAVENOUS | Status: AC
  Administered 2023-06-08: 500 mg via INTRAVENOUS
  Filled 2023-06-08: qty 100

## 2023-06-08 MED ORDER — ONDANSETRON 4 MG PO TBDP: 4.0000 mg | ORAL_TABLET | Freq: Four times a day (QID) | ORAL | Status: DC | PRN

## 2023-06-08 MED ORDER — INSULIN ASPART 100 UNIT/ML IJ SOLN: 0.0000 [IU] | Freq: Three times a day (TID) | INTRAMUSCULAR | Status: DC

## 2023-06-08 MED ORDER — ENOXAPARIN SODIUM 40 MG/0.4ML IJ SOSY
40.0000 mg | PREFILLED_SYRINGE | INTRAMUSCULAR | Status: DC
  Administered 2023-06-09 – 2023-06-12 (×4): 40 mg via SUBCUTANEOUS
  Filled 2023-06-08 (×4): qty 0.4

## 2023-06-08 MED ORDER — SODIUM CHLORIDE 0.9 % IV SOLN: INTRAVENOUS | Status: DC

## 2023-06-08 MED ORDER — POLYETHYLENE GLYCOL 3350 17 G PO PACK
17.0000 g | PACK | Freq: Every day | ORAL | Status: DC | PRN
  Administered 2023-06-11: 17 g via ORAL
  Filled 2023-06-08: qty 1

## 2023-06-08 MED ORDER — FENTANYL CITRATE PF 50 MCG/ML IJ SOSY
50.0000 ug | PREFILLED_SYRINGE | Freq: Once | INTRAMUSCULAR | Status: AC
  Administered 2023-06-08: 50 ug via INTRAVENOUS
  Filled 2023-06-08: qty 1

## 2023-06-08 MED ORDER — ONDANSETRON HCL 4 MG/2ML IJ SOLN
4.0000 mg | Freq: Once | INTRAMUSCULAR | Status: AC
  Administered 2023-06-08: 4 mg via INTRAVENOUS
  Filled 2023-06-08: qty 2

## 2023-06-08 MED ORDER — SODIUM CHLORIDE 0.9 % IV SOLN
2.0000 g | Freq: Once | INTRAVENOUS | Status: AC
  Administered 2023-06-08: 2 g via INTRAVENOUS
  Filled 2023-06-08: qty 12.5

## 2023-06-08 MED ORDER — INSULIN ASPART 100 UNIT/ML IJ SOLN: 0.0000 [IU] | Freq: Every day | INTRAMUSCULAR | Status: DC

## 2023-06-08 MED ORDER — INSULIN ASPART 100 UNIT/ML IJ SOLN
0.0000 [IU] | INTRAMUSCULAR | Status: DC
  Administered 2023-06-11 – 2023-06-12 (×3): 2 [IU] via SUBCUTANEOUS
  Filled 2023-06-08 (×3): qty 1

## 2023-06-08 MED ORDER — SODIUM CHLORIDE 0.9 % IV SOLN
2.0000 g | Freq: Three times a day (TID) | INTRAVENOUS | Status: DC
  Administered 2023-06-09 – 2023-06-12 (×10): 2 g via INTRAVENOUS
  Filled 2023-06-08 (×11): qty 12.5

## 2023-06-08 MED ORDER — ONDANSETRON HCL 4 MG/2ML IJ SOLN
4.0000 mg | Freq: Four times a day (QID) | INTRAMUSCULAR | Status: DC | PRN
  Administered 2023-06-12: 4 mg via INTRAVENOUS
  Filled 2023-06-08: qty 2

## 2023-06-08 MED ORDER — ACETAMINOPHEN 500 MG PO TABS
1000.0000 mg | ORAL_TABLET | Freq: Four times a day (QID) | ORAL | Status: DC
  Administered 2023-06-08 – 2023-06-12 (×14): 1000 mg via ORAL
  Filled 2023-06-08 (×14): qty 2

## 2023-06-08 MED ORDER — METRONIDAZOLE 500 MG/100ML IV SOLN
500.0000 mg | Freq: Two times a day (BID) | INTRAVENOUS | Status: DC
  Administered 2023-06-09 – 2023-06-12 (×7): 500 mg via INTRAVENOUS
  Filled 2023-06-08 (×8): qty 100

## 2023-06-08 MED ORDER — PANTOPRAZOLE SODIUM 40 MG IV SOLR
40.0000 mg | Freq: Every day | INTRAVENOUS | Status: DC
  Administered 2023-06-08 – 2023-06-11 (×4): 40 mg via INTRAVENOUS
  Filled 2023-06-08 (×4): qty 10

## 2023-06-08 MED ORDER — HYDROMORPHONE HCL 1 MG/ML IJ SOLN: 0.5000 mg | INTRAMUSCULAR | Status: DC | PRN

## 2023-06-08 NOTE — ED Notes (Signed)
Patient to CT.

## 2023-06-08 NOTE — Progress Notes (Signed)
PHARMACY -  BRIEF ANTIBIOTIC NOTE   Pharmacy has received consult(s) for cefepime from an ED provider.  The patient's profile has been reviewed for ht/wt/allergies/indication/available labs.    One time order(s) placed by MD for cefepime 2 gm  Further antibiotics/pharmacy consults should be ordered by admitting physician if indicated.                       Thank you, Jamie [name] A 06/08/2023  6:28 PM

## 2023-06-08 NOTE — Progress Notes (Signed)
06/08/23  Case discussed with Dr. Erma Heritage.  Patient presents with several days of abdominal pain, associated with lightheadedness, dizziness, decreased appetite.  Labs showed AKI with dehydration, mild lactic acidosis, normal WBC.  CT shows perforated appendicitis with RLQ inflammatory changes and some distended small bowel loops.  On personal view of the images, perforation seems to be contained in the right lower quadrant, no abscess, with thickened terminal ileum as it reaches the cecum.  Will admit to surgical team.  Will attempt conservative management given the inflammatory changes, but may need surgery if no improvement.  Will start IV hydration, broad spectrum abx, pain control.  Full H&P to follow.  Henrene Dodge, MD

## 2023-06-08 NOTE — ED Provider Notes (Signed)
[facility] Provider Note    Event Date/Time   First MD Initiated Contact with Patient 06/08/23 1821     (approximate)   History   Abdominal Pain   HPI  Jamie [name] is a 62 y.o. female here with generalized weakness, lightheadedness, abdominal discomfort.  The patient symptoms started several days ago with mild fatigue, slight confusion, and decreased appetite.  She has since developed significant diarrhea although she initially had some mild constipation for which she took 1 dose of laxative.  She has had decreased appetite.  She had dry mouth.  She had lightheadedness with standing.  She did have mild headache as well although this was resolved.  No confusion.  No history of similar symptoms.  No recent antibiotic use.     Physical Exam   Triage Vital Signs: ED Triage Vitals  Encounter Vitals Group     BP 06/08/23 1715 101/67     Systolic BP Percentile --      Diastolic BP Percentile --      Pulse Rate 06/08/23 1715 (!) 150     Resp 06/08/23 1715 (!) 24     Temp 06/08/23 1715 98.9 F (37.2 C)     Temp Source 06/08/23 1715 Oral     SpO2 06/08/23 1719 96 %     Weight 06/08/23 1713 186 lb (84.4 kg)     Height 06/08/23 1713 5\' 7"  (1.702 m)     Head Circumference --      Peak Flow --      Pain Score 06/08/23 1713 8     Pain Loc --      Pain Education --      Exclude from Growth Chart --     Most recent vital signs: Vitals:   06/08/23 1940 06/08/23 2030  BP: 100/64 114/66  Pulse: (!) 111 (!) 107  Resp: 20 (!) 21  Temp:  99.1 F (37.3 C)  SpO2:  98%     General: Awake, no distress.  CV:  Good peripheral perfusion.  Tachycardic. Resp:  Normal work of breathing.  Lungs clear to auscultation bilaterally. Abd:  No distention.  Bilateral lower quadrant tenderness, no specific rebound or guarding.  No peritonitis. Other:  Dry mucous membranes.  No focal neurological deficit.  Alert and oriented.  No meningismus.   ED Results /  Procedures / Treatments   Labs (all labs ordered are listed, but only abnormal results are displayed) Labs Reviewed  BASIC METABOLIC PANEL - Abnormal; Notable for the following components:      Result Value   Sodium 125 (*)    Potassium 3.4 (*)    Chloride 96 (*)    Glucose, Bld 140 (*)    BUN 37 (*)    Creatinine, Ser 1.76 (*)    Calcium 8.5 (*)    GFR, Estimated 33 (*)    All other components within normal limits  URINALYSIS, ROUTINE W REFLEX MICROSCOPIC - Abnormal; Notable for the following components:   Color, Urine AMBER (*)    APPearance CLOUDY (*)    Ketones, ur 5 (*)    Protein, ur 100 (*)    Bacteria, UA RARE (*)    All other components within normal limits  HEPATIC FUNCTION PANEL - Abnormal; Notable for the following components:   Bilirubin, Direct 0.4 (*)    All other components within normal limits  LACTIC ACID, PLASMA - Abnormal; Notable for the following components:   Lactic Acid, Venous  2.5 (*)    All other components within normal limits  CBG MONITORING, ED - Abnormal; Notable for the following components:   Glucose-Capillary 107 (*)    All other components within normal limits  RESP PANEL BY RT-PCR (RSV, FLU A&B, COVID)  RVPGX2  CULTURE, BLOOD (ROUTINE X 2)  CULTURE, BLOOD (ROUTINE X 2)  CBC  LIPASE, BLOOD  LACTIC ACID, PLASMA  HIV ANTIBODY (ROUTINE TESTING W REFLEX)  BASIC METABOLIC PANEL  MAGNESIUM  CBC  HEMOGLOBIN A1C  TROPONIN I (HIGH SENSITIVITY)     EKG Sinus tachycardia, trickle 148.  PR 116, QRS is 68, QTc 530.  No acute ST elevations or depressions.   RADIOLOGY Chest x-ray: Clear CT head: No acute intracranial malady CT abdomen/pelvis:perforated appendicitis, no abscess   I also independently reviewed and agree with radiologist interpretations.   PROCEDURES:  Critical Care performed: Yes, see critical care procedure note(s)   .Critical Care  Performed by: Shaune Pollack, MD Authorized by: Shaune Pollack, MD   Critical  care provider statement:    Critical care time (minutes):  30   Critical care time was exclusive of:  Separately billable procedures and treating other patients   Critical care was necessary to treat or prevent imminent or life-threatening deterioration of the following conditions:  Cardiac failure, circulatory failure, respiratory failure and sepsis   Critical care was time spent personally by me on the following activities:  Development of treatment plan with patient or surrogate, discussions with consultants, evaluation of patient's response to treatment, examination of patient, ordering and review of laboratory studies, ordering and review of radiographic studies, ordering and performing treatments and interventions, pulse oximetry, re-evaluation of patient's condition and review of old charts     MEDICATIONS ORDERED IN ED: Medications  acetaminophen (TYLENOL) tablet 1,000 mg (1,000 mg Oral Given 06/08/23 2036)  ketorolac (TORADOL) 30 MG/ML injection 15 mg (has no administration in time range)  HYDROmorphone (DILAUDID) injection 0.5 mg (has no administration in time range)  polyethylene glycol (MIRALAX / GLYCOLAX) packet 17 g (has no administration in time range)  ondansetron (ZOFRAN-ODT) disintegrating tablet 4 mg (has no administration in time range)    Or  ondansetron (ZOFRAN) injection 4 mg (has no administration in time range)  pantoprazole (PROTONIX) injection 40 mg (has no administration in time range)  enoxaparin (LOVENOX) injection 40 mg (has no administration in time range)  0.9 %  sodium chloride infusion ( Intravenous New Bag/Given 06/08/23 2045)  ceFEPIme (MAXIPIME) 2 g in sodium chloride 0.9 % 100 mL IVPB (has no administration in time range)    And  metroNIDAZOLE (FLAGYL) IVPB 500 mg (has no administration in time range)  insulin aspart (novoLOG) injection 0-15 Units ( Subcutaneous Not Given 06/08/23 2037)  lactated ringers bolus 1,000 mL (0 mLs Intravenous Stopped 06/08/23  1938)    And  lactated ringers bolus 1,000 mL (0 mLs Intravenous Stopped 06/08/23 2014)    And  lactated ringers bolus 1,000 mL (0 mLs Intravenous Stopped 06/08/23 2046)  ceFEPIme (MAXIPIME) 2 g in sodium chloride 0.9 % 100 mL IVPB (0 g Intravenous Stopped 06/08/23 1937)  metroNIDAZOLE (FLAGYL) IVPB 500 mg (0 mg Intravenous Stopped 06/08/23 2046)  fentaNYL (SUBLIMAZE) injection 50 mcg (50 mcg Intravenous Given 06/08/23 2036)  ondansetron (ZOFRAN) injection 4 mg (4 mg Intravenous Given 06/08/23 2036)     IMPRESSION / MDM / ASSESSMENT AND PLAN / ED COURSE  I reviewed the triage vital signs and the nursing notes.  Differential diagnosis includes, but is not limited to, sepsis 2/2 UTI, diverticulitis, colitis, enteritis, appendicitis, unlikely meningitis/encephalitis  Patient's presentation is most consistent with acute presentation with potential threat to life or bodily function.  The patient is on the cardiac monitor to evaluate for evidence of arrhythmia and/or significant heart rate changes  62 yo F here with abdominal pain, fatigue. Pt arrives with fever, tachycardia, hypotension concerning for sepsis. UA unremarkable. Labs show  lactic acidosis, significant AKI and likely hypovolemic hyponatremia. IVF given. WBC normal. CT scan obtained, shows perforated appendicitis, no abscess. Pt was given 30 cc/kg fluids and broad-spectrum abx. Will admit to Dr. Aleen Campi with General Surgery. Pt and family updated. BP stable and HR improving to low 100s w/ fluids - pt feels improved.   FINAL CLINICAL IMPRESSION(S) / ED DIAGNOSES   Final diagnoses:  Perforated appendicitis  Sepsis without acute organ dysfunction, due to unspecified organism [facility])     Rx / DC Orders   ED Discharge Orders     None        Note:  This document was prepared using Dragon voice recognition software and may include unintentional dictation errors.   Shaune Pollack, MD 06/08/23  [phone]

## 2023-06-08 NOTE — ED Triage Notes (Addendum)
Pt to triage via wheelchair.  Pt reports abd pain for 3 days.  Diarrhea  x 6 today.  No vomiting.  Pt reports sob   no chest pain.    Pt alert  speech clear.

## 2023-06-08 NOTE — Sedation Documentation (Signed)
Elink monitoring for the code sepsis protocol.  

## 2023-06-08 NOTE — Progress Notes (Signed)
CODE SEPSIS - PHARMACY COMMUNICATION  **Broad Spectrum Antibiotics should be administered within 1 hour of Sepsis diagnosis**  Time Code Sepsis Called/Page Received: 1822  Antibiotics Ordered: cefepime  Time of 1st antibiotic administration: 1846  Additional action taken by pharmacy:    If necessary, Name of Provider/Nurse Contacted:      Angelique Blonder ,PharmD Clinical Pharmacist  06/08/2023  6:53 PM

## 2023-06-08 NOTE — ED Notes (Signed)
Need another urine sample, QNS

## 2023-06-09 DIAGNOSIS — K3532 Acute appendicitis with perforation and localized peritonitis, without abscess: Secondary | ICD-10-CM

## 2023-06-09 LAB — MAGNESIUM: Magnesium: 1.8 mg/dL (ref 1.7–2.4)

## 2023-06-09 LAB — BASIC METABOLIC PANEL
Anion gap: 9 (ref 5–15)
BUN: 29 mg/dL — ABNORMAL HIGH (ref 8–23)
CO2: 23 mmol/L (ref 22–32)
Calcium: 8.5 mg/dL — ABNORMAL LOW (ref 8.9–10.3)
Chloride: 103 mmol/L (ref 98–111)
Creatinine, Ser: 1.02 mg/dL — ABNORMAL HIGH (ref 0.44–1.00)
GFR, Estimated: >60 mL/min (ref >60)
Glucose, Bld: 103 mg/dL — ABNORMAL HIGH (ref 70–99)
Potassium: 3.5 mmol/L (ref 3.5–5.1)
Sodium: 135 mmol/L (ref 135–145)

## 2023-06-09 LAB — GLUCOSE, CAPILLARY
Glucose-Capillary: 80 mg/dL (ref 70–99)
Glucose-Capillary: 82 mg/dL (ref 70–99)
Glucose-Capillary: 84 mg/dL (ref 70–99)
Glucose-Capillary: 88 mg/dL (ref 70–99)
Glucose-Capillary: 96 mg/dL (ref 70–99)
Glucose-Capillary: 97 mg/dL (ref 70–99)

## 2023-06-09 LAB — CBC
HCT: 31.5 % — ABNORMAL LOW (ref 36.0–46.0)
Hemoglobin: 10.7 g/dL — ABNORMAL LOW (ref 12.0–15.0)
MCH: 29.6 pg (ref 26.0–34.0)
MCHC: 34 g/dL (ref 30.0–36.0)
MCV: 87.3 fL (ref 80.0–100.0)
Platelets: 133 10*3/uL — ABNORMAL LOW (ref 150–400)
RBC: 3.61 MIL/uL — ABNORMAL LOW (ref 3.87–5.11)
RDW: 12.7 % (ref 11.5–15.5)
WBC: 6 10*3/uL (ref 4.0–10.5)
nRBC: 0 % (ref 0.0–0.2)

## 2023-06-09 LAB — CULTURE, BLOOD (ROUTINE X 2): Culture: NO GROWTH

## 2023-06-09 LAB — HIV ANTIBODY (ROUTINE TESTING W REFLEX): HIV Screen 4th Generation wRfx: NONREACTIVE

## 2023-06-09 MED ORDER — ORAL CARE MOUTH RINSE: 15.0000 mL | OROMUCOSAL | Status: DC | PRN

## 2023-06-09 NOTE — Plan of Care (Signed)
  Problem: Metabolic: Goal: Ability to maintain appropriate glucose levels will improve Outcome: Progressing   Problem: Skin Integrity: Goal: Risk for impaired skin integrity will decrease Outcome: Progressing   Problem: Clinical Measurements: Goal: Cardiovascular complication will be avoided Outcome: Progressing   Problem: Activity: Goal: Risk for activity intolerance will decrease Outcome: Progressing

## 2023-06-09 NOTE — Plan of Care (Signed)
  Problem: Coping: Goal: Ability to adjust to condition or change in health will improve Outcome: Progressing   Problem: Metabolic: Goal: Ability to maintain appropriate glucose levels will improve Outcome: Progressing   Problem: Skin Integrity: Goal: Risk for impaired skin integrity will decrease Outcome: Progressing   Problem: Clinical Measurements: Goal: Cardiovascular complication will be avoided Outcome: Progressing   Problem: Activity: Goal: Risk for activity intolerance will decrease Outcome: Progressing

## 2023-06-09 NOTE — Plan of Care (Signed)
  Problem: Education: Goal: Ability to describe self-care measures that may prevent or decrease complications (Diabetes Survival Skills Education) will improve Outcome: Progressing Goal: Individualized Educational Video(s) Outcome: Progressing   Problem: Coping: Goal: Ability to adjust to condition or change in health will improve Outcome: Progressing   Problem: Fluid Volume: Goal: Ability to maintain a balanced intake and output will improve Outcome: Progressing   Problem: Health Behavior/Discharge Planning: Goal: Ability to identify and utilize available resources and services will improve Outcome: Progressing Goal: Ability to manage health-related needs will improve Outcome: Progressing   Problem: Metabolic: Goal: Ability to maintain appropriate glucose levels will improve Outcome: Progressing   Problem: Skin Integrity: Goal: Risk for impaired skin integrity will decrease Outcome: Progressing

## 2023-06-09 NOTE — H&P (Signed)
[facility] SURGICAL HISTORY & PHYSICAL (cpt [phone])  HISTORY OF PRESENT ILLNESS (HPI):  62 y.o. female presented to [facility] ED yesterday for abdominal pain. Patient reports she has been feeling "not well" and fatigued over the course of the last 2 weeks. However, on Tuesday, she developed severe abdominal pain worse in right abdomen. She thought this was constipation and took laxative which resulted in diarrhea. Reports associated nausea. No fever, chills, cough, CP, SOB, emesis. No history of similar. No previous intra-abdominal surgeries. Work up in the ED revealed a normal WBC at 6.6K, Hgb to 12.4, AKI with sCr - 1.76, hypokalemia to 3.4, hyponatremia to 125, and lactic acidosis to 2.5. CT Abdomen/Pelvis concerning for acute appendicitis with inflammatory response in RLQ concerning for perforation.  General surgery is consulted by emergency medicine physician Dr Shaune Pollack, MD for evaluation and management of appendicitis.   PAST MEDICAL HISTORY (PMH):  Past Medical History:  Diagnosis Date   Arthritis    Depression    Diabetes (HCC)    High cholesterol    Hypertension    Postmenopausal    LMP 2012    Reviewed. Otherwise negative.   PAST SURGICAL HISTORY [facility]):  Past Surgical History:  Procedure Laterality Date   COLONOSCOPY WITH PROPOFOL N/A 11/16/2022   Procedure: COLONOSCOPY WITH PROPOFOL;  Surgeon: Toledo, Boykin Nearing, MD;  Location: [facility] ENDOSCOPY;  Service: Gastroenterology;  Laterality: N/A;   JOINT REPLACEMENT     TOTAL HIP ARTHROPLASTY Left 09/10/2019   Procedure: TOTAL HIP ARTHROPLASTY ANTERIOR APPROACH;  Surgeon: Kennedy Bucker, MD;  Location: [facility] ORS;  Service: Orthopedics;  Laterality: Left;   TOTAL HIP ARTHROPLASTY Right 12/12/2019   Procedure: RIGHT TOTAL HIP ARTHROPLASTY ANTERIOR APPROACH;  Surgeon: Kennedy Bucker, MD;  Location: [facility] ORS;  Service: Orthopedics;  Laterality: Right;   TUBAL LIGATION      Reviewed. Otherwise negative.   MEDICATIONS:   Prior to Admission medications   Medication Sig Start Date End Date Taking? Authorizing Provider  atorvastatin (LIPITOR) 40 MG tablet Take 40 mg by mouth every evening.  04/21/16   [provider]  enoxaparin (LOVENOX) 40 MG/0.4ML injection Inject 0.4 mLs (40 mg total) into the skin daily for 10 days. 12/12/19 12/22/19  Kennedy Bucker, MD  lisinopril (ZESTRIL) 10 MG tablet Take 10 mg by mouth daily.    [provider]  metFORMIN (GLUCOPHAGE) 1000 MG tablet Take 1,000 mg by mouth every evening.  04/21/16   [provider]  PARoxetine (PAXIL) 10 MG tablet Take 10 mg by mouth every evening.     [provider]     ALLERGIES:  Allergies  Allergen Reactions   Clindamycin/Lincomycin Rash   Penicillin G Rash    Did it involve swelling of the face/tongue/throat, SOB, or low BP? No Did it involve sudden or severe rash/hives, skin peeling, or any reaction on the inside of your mouth or nose? Yes Did you need to seek medical attention at a hospital or doctor's office? Yes When did it last happen?      when she was younger If all above answers are "NO", may proceed with cephalosporin use.      SOCIAL HISTORY:  Social History   Socioeconomic History   Marital status: Married    Spouse name: Not on file   Number of children: Not on file   Years of education: Not on file   Highest education level: Not on file  Occupational History   Not on file  Tobacco Use  Smoking status: Former    Types: Cigarettes   Smokeless tobacco: Never  Vaping Use   Vaping status: Never Used  Substance and Sexual Activity   Alcohol use: Never   Drug use: Never   Sexual activity: Not on file  Other Topics Concern   Not on file  Social History Narrative   Not on file   Social Determinants of Health   Financial Resource Strain: Low Risk  (03/17/2023)   Received from [facility], [facility]   Overall Financial Resource Strain (CARDIA)     Difficulty of Paying Living Expenses: Not hard at all  Food Insecurity: No Food Insecurity (06/08/2023)   Hunger Vital Sign    Worried About Running Out of Food in the Last Year: Never true    Ran Out of Food in the Last Year: Never true  Transportation Needs: No Transportation Needs (06/08/2023)   PRAPARE - Administrator, Civil Service (Medical): No    Lack of Transportation (Non-Medical): No  Physical Activity: Not on file  Stress: Not on file  Social Connections: Not on file  Intimate Partner Violence: Not At Risk (06/08/2023)   Humiliation, Afraid, Rape, and Kick questionnaire    Fear of Current or Ex-Partner: No    Emotionally Abused: No    Physically Abused: No    Sexually Abused: No     FAMILY HISTORY:  Family History  Problem Relation Age of Onset   Fibroids Sister 62   Uterine cancer Sister 64   Uterine cancer Mother 65   Lung cancer Brother 50   Bone cancer Brother 74   Breast cancer Neg Hx     Otherwise negative.   REVIEW OF SYSTEMS:  Review of Systems  Constitutional:  Negative for chills and fever.  Respiratory:  Negative for cough and shortness of breath.   Cardiovascular:  Negative for chest pain and palpitations.  Gastrointestinal:  Positive for abdominal pain, diarrhea and nausea. Negative for blood in stool, constipation and vomiting.  Genitourinary:  Negative for dysuria and urgency.  All other systems reviewed and are negative.   VITAL SIGNS:  Temp:  [97.7 F (36.5 C)-99.1 F (37.3 C)] 98.3 F (36.8 C) (07/19 0458) Pulse Rate:  [100-150] 100 (07/19 0458) Resp:  [16-24] 17 (07/19 0458) BP: (95-114)/(62-74) 110/74 (07/19 0458) SpO2:  [96 %-100 %] 100 % (07/19 0458) Weight:  [82.5 kg-84.4 kg] 82.5 kg (07/18 2205)     Height: 5\' 7"  (170.2 cm) (stated) Weight: 82.5 kg BMI (Calculated): 28.48   PHYSICAL EXAM:  Physical Exam Vitals and nursing note reviewed. Exam conducted with a chaperone present.  Constitutional:      General: She is  not in acute distress.    Appearance: She is well-developed. She is not ill-appearing.     Comments: Resting in bed; NAD  HENT:     Head: Normocephalic and atraumatic.  Eyes:     General: No scleral icterus.    Extraocular Movements: Extraocular movements intact.  Cardiovascular:     Rate and Rhythm: Tachycardia present.     Heart sounds: Normal heart sounds.  Pulmonary:     Effort: Pulmonary effort is normal. No respiratory distress.  Abdominal:     General: There is no distension.     Palpations: Abdomen is soft.     Tenderness: There is abdominal tenderness in the right lower quadrant and suprapubic area. There is no guarding or rebound.  Genitourinary:    Comments: Deferred  Skin:    General: Skin is warm and dry.     Coloration: Skin is not jaundiced or pale.  Neurological:     General: No focal deficit present.     Mental Status: She is alert and oriented to person, place, and time.  Psychiatric:        Mood and Affect: Mood normal.        Behavior: Behavior normal.     INTAKE/OUTPUT:  This shift: No intake/output data recorded.  Last 2 shifts: @IOLAST2SHIFTS @  Labs:     Latest Ref Rng & Units 06/08/2023    5:17 PM 04/07/2022    7:39 AM 12/05/2019    8:09 AM  CBC  WBC 4.0 - 10.5 K/uL 6.6  5.1  4.1   Hemoglobin 12.0 - 15.0 g/dL 95.6  21.3  08.6   Hematocrit 36.0 - 46.0 % 36.3  35.4  36.0   Platelets 150 - 400 K/uL 167  226  242       Latest Ref Rng & Units 06/08/2023    6:33 PM 06/08/2023    5:17 PM 12/05/2019    8:09 AM  CMP  Glucose 70 - 99 mg/dL  578  469   BUN 8 - 23 mg/dL  37  10   Creatinine 6.29 - 1.00 mg/dL  5.28  4.13   Sodium 244 - 145 mmol/L  125  140   Potassium 3.5 - 5.1 mmol/L  3.4  3.9   Chloride 98 - 111 mmol/L  96  105   CO2 22 - 32 mmol/L  22  28   Calcium 8.9 - 10.3 mg/dL  8.5  9.3   Total Protein 6.5 - 8.1 g/dL 7.8   8.1   Total Bilirubin 0.3 - 1.2 mg/dL 1.1   0.6   Alkaline Phos 38 - 126 U/L 77   91   AST 15 - 41 U/L 24   23   ALT 0  - 44 U/L 15   23     Imaging studies:   CT Abdomen/Pelvis (06/08/2023) personally reviewed with RLQ inflammatory changes consistent with appendicitis and likely perforation, no gross abscess, no marked pneumoperitoneum, and radiologist report reviewed below:  IMPRESSION: 1. Findings consistent with acute appendicitis with perforation. No organized abscess. 2. Dilated fluid-filled loops of small bowel likely due to ileus favor ileus over partial obstruction. Thickened terminal ileum with inflammation, likely reactive. 3. Gallstone. 4. Aortic atherosclerosis.   Assessment/Plan: (ICD-10's: K35.30) 62 y.o. female with acute appendicitis with significant inflammatory response in RLQ concerning for perforation without abscess.    - Admit to general surgery  - Plan to manage conservatively in effort to avoid extensive surgery (ie; right colectomy) in this setting. If she responds well, will plan for interval appendectomy in ~8 months   - NPO for now; If pain improves tomorrow, okay for CLD  - Continue IV Abx (Cefepime & Flagyl)  - Monitor abdominal examination; on-going bowel function  - May benefit from repeat CT in 48-72 hours pending clinical condition   - Pain control prn; antiemetics prn  - Follow up labs  - Mobilize   All of the above findings and recommendations were discussed with the patient and her family, and all of her and family's questions were answered to their expressed satisfaction.  -- Lynden Oxford, PA-C [facility] 06/09/2023, 7:41 AM M-F: 7am - 4pm

## 2023-06-10 LAB — CBC
HCT: 29.1 % — ABNORMAL LOW (ref 36.0–46.0)
Hemoglobin: 9.9 g/dL — ABNORMAL LOW (ref 12.0–15.0)
MCH: 30 pg (ref 26.0–34.0)
MCHC: 34 g/dL (ref 30.0–36.0)
MCV: 88.2 fL (ref 80.0–100.0)
Platelets: 143 10*3/uL — ABNORMAL LOW (ref 150–400)
RBC: 3.3 MIL/uL — ABNORMAL LOW (ref 3.87–5.11)
RDW: 13 % (ref 11.5–15.5)
WBC: 5.9 10*3/uL (ref 4.0–10.5)
nRBC: 0 % (ref 0.0–0.2)

## 2023-06-10 LAB — BASIC METABOLIC PANEL
Anion gap: 7 (ref 5–15)
BUN: 29 mg/dL — ABNORMAL HIGH (ref 8–23)
CO2: 19 mmol/L — ABNORMAL LOW (ref 22–32)
Calcium: 8.1 mg/dL — ABNORMAL LOW (ref 8.9–10.3)
Chloride: 111 mmol/L (ref 98–111)
Creatinine, Ser: 0.82 mg/dL (ref 0.44–1.00)
GFR, Estimated: >60 mL/min (ref >60)
Glucose, Bld: 85 mg/dL (ref 70–99)
Potassium: 3.2 mmol/L — ABNORMAL LOW (ref 3.5–5.1)
Sodium: 137 mmol/L (ref 135–145)

## 2023-06-10 LAB — HEMOGLOBIN A1C
Hgb A1c MFr Bld: 6.1 % — ABNORMAL HIGH (ref 4.8–5.6)
Mean Plasma Glucose: 128 mg/dL

## 2023-06-10 LAB — GLUCOSE, CAPILLARY
Glucose-Capillary: 76 mg/dL (ref 70–99)
Glucose-Capillary: 84 mg/dL (ref 70–99)
Glucose-Capillary: 105 mg/dL — ABNORMAL HIGH (ref 70–99)
Glucose-Capillary: 81 mg/dL (ref 70–99)
Glucose-Capillary: 120 mg/dL — ABNORMAL HIGH (ref 70–99)
Glucose-Capillary: 106 mg/dL — ABNORMAL HIGH (ref 70–99)

## 2023-06-10 LAB — CULTURE, BLOOD (ROUTINE X 2): Special Requests: ADEQUATE

## 2023-06-10 MED ORDER — POTASSIUM CHLORIDE CRYS ER 20 MEQ PO TBCR
20.0000 meq | EXTENDED_RELEASE_TABLET | ORAL | Status: AC
  Administered 2023-06-10 (×2): 20 meq via ORAL
  Filled 2023-06-10 (×2): qty 1

## 2023-06-10 NOTE — Progress Notes (Signed)
Subjective:  CC: Jamie [name] is a 62 y.o. female  Hospital stay day 2,   perforated appendicitis  HPI: No acute issues overnight.  ROS:  General: Denies weight loss, weight gain, fatigue, fevers, chills, and night sweats. Heart: Denies chest pain, palpitations, racing heart, irregular heartbeat, leg pain or swelling, and decreased activity tolerance. Respiratory: Denies breathing difficulty, shortness of breath, wheezing, cough, and sputum. GI: Denies change in appetite, heartburn, nausea, vomiting, constipation, diarrhea, and blood in stool. GU: Denies difficulty urinating, pain with urinating, urgency, frequency, blood in urine.   Objective:   Temp:  [97.7 F (36.5 C)-98.6 F (37 C)] 98.6 F (37 C) (07/20 0758) Pulse Rate:  [71-105] 71 (07/20 0758) Resp:  [18] 18 (07/20 0758) BP: (106-108)/(67-74) 107/74 (07/20 0758) SpO2:  [99 %-100 %] 100 % (07/20 0758)     Height: 5\' 7"  (170.2 cm) (stated) Weight: 82.5 kg BMI (Calculated): 28.48   Intake/Output this shift:   Intake/Output Summary (Last 24 hours) at 06/10/2023 1224 Last data filed at 06/09/2023 2315 Gross per 24 hour  Intake 1391.32 ml  Output 300 ml  Net 1091.32 ml    Constitutional :  alert, cooperative, appears stated age, and no distress  Respiratory:  clear to auscultation bilaterally  Cardiovascular:  regular rate and rhythm  Gastrointestinal: Soft, no guarding, focal tenderness to palpation right lower quadrant minimal. .   Skin: Cool and moist.   Psychiatric: Normal affect, non-agitated, not confused       LABS:     Latest Ref Rng & Units 06/10/2023    4:55 AM 06/09/2023    9:30 AM 06/08/2023    6:33 PM  CMP  Glucose 70 - 99 mg/dL 85  161    BUN 8 - 23 mg/dL 29  29    Creatinine 0.96 - 1.00 mg/dL 0.45  4.09    Sodium 811 - 145 mmol/L 137  135    Potassium 3.5 - 5.1 mmol/L 3.2  3.5    Chloride 98 - 111 mmol/L 111  103    CO2 22 - 32 mmol/L 19  23    Calcium 8.9 - 10.3 mg/dL 8.1  8.5    Total  Protein 6.5 - 8.1 g/dL   7.8   Total Bilirubin 0.3 - 1.2 mg/dL   1.1   Alkaline Phos 38 - 126 U/L   77   AST 15 - 41 U/L   24   ALT 0 - 44 U/L   15       Latest Ref Rng & Units 06/10/2023    4:55 AM 06/09/2023    9:30 AM 06/08/2023    5:17 PM  CBC  WBC 4.0 - 10.5 K/uL 5.9  6.0  6.6   Hemoglobin 12.0 - 15.0 g/dL 9.9  91.4  78.2   Hematocrit 36.0 - 46.0 % 29.1  31.5  36.3   Platelets 150 - 400 K/uL 143  133  167     RADS: N/A Assessment:   Perforated appendicitis.  Will continue conservative management for now since clinically stable.  IV antibiotics.  Continue n.p.o. for another day due to no significant report of pain improvement.  labs/images/medications/previous chart entries reviewed personally and relevant changes/updates noted above.

## 2023-06-10 NOTE — Plan of Care (Signed)
  Problem: Fluid Volume: Goal: Ability to maintain a balanced intake and output will improve Outcome: Progressing   Problem: Safety: Goal: Ability to remain free from injury will improve Outcome: Progressing   Problem: Pain Managment: Goal: General experience of comfort will improve Outcome: Progressing   Problem: Activity: Goal: Risk for activity intolerance will decrease Outcome: Progressing

## 2023-06-11 LAB — CBC
HCT: 29.4 % — ABNORMAL LOW (ref 36.0–46.0)
Hemoglobin: 10.1 g/dL — ABNORMAL LOW (ref 12.0–15.0)
MCH: 29.8 pg (ref 26.0–34.0)
MCHC: 34.4 g/dL (ref 30.0–36.0)
MCV: 86.7 fL (ref 80.0–100.0)
Platelets: 163 10*3/uL (ref 150–400)
RBC: 3.39 MIL/uL — ABNORMAL LOW (ref 3.87–5.11)
RDW: 13.7 % (ref 11.5–15.5)
WBC: 5.8 10*3/uL (ref 4.0–10.5)
nRBC: 0 % (ref 0.0–0.2)

## 2023-06-11 LAB — GLUCOSE, CAPILLARY
Glucose-Capillary: 94 mg/dL (ref 70–99)
Glucose-Capillary: 96 mg/dL (ref 70–99)
Glucose-Capillary: 137 mg/dL — ABNORMAL HIGH (ref 70–99)
Glucose-Capillary: 104 mg/dL — ABNORMAL HIGH (ref 70–99)
Glucose-Capillary: 122 mg/dL — ABNORMAL HIGH (ref 70–99)
Glucose-Capillary: 84 mg/dL (ref 70–99)

## 2023-06-11 LAB — BASIC METABOLIC PANEL
Anion gap: 5 (ref 5–15)
BUN: 30 mg/dL — ABNORMAL HIGH (ref 8–23)
CO2: 19 mmol/L — ABNORMAL LOW (ref 22–32)
Calcium: 8.3 mg/dL — ABNORMAL LOW (ref 8.9–10.3)
Chloride: 113 mmol/L — ABNORMAL HIGH (ref 98–111)
Creatinine, Ser: 0.85 mg/dL (ref 0.44–1.00)
GFR, Estimated: >60 mL/min (ref >60)
Glucose, Bld: 116 mg/dL — ABNORMAL HIGH (ref 70–99)
Potassium: 3.6 mmol/L (ref 3.5–5.1)
Sodium: 137 mmol/L (ref 135–145)

## 2023-06-11 NOTE — Progress Notes (Signed)
Subjective:  CC: Jamie [name] is a 62 y.o. female  Hospital stay day 3,   perforated appendicitis  HPI: No acute issues overnight.  Felt much better last night so actually advance to clears.  No issues with a clear liquid diet.  Passing flatus but no reported BM.  ROS:  General: Denies weight loss, weight gain, fatigue, fevers, chills, and night sweats. Heart: Denies chest pain, palpitations, racing heart, irregular heartbeat, leg pain or swelling, and decreased activity tolerance. Respiratory: Denies breathing difficulty, shortness of breath, wheezing, cough, and sputum. GI: Denies change in appetite, heartburn, nausea, vomiting, constipation, diarrhea, and blood in stool. GU: Denies difficulty urinating, pain with urinating, urgency, frequency, blood in urine.   Objective:   Temp:  [97.9 F (36.6 C)-98.9 F (37.2 C)] 98 F (36.7 C) (07/21 0732) Pulse Rate:  [59-72] 62 (07/21 0732) Resp:  [16-20] 18 (07/21 0732) BP: (93-117)/(58-74) 115/74 (07/21 0732) SpO2:  [99 %-100 %] 100 % (07/21 0732)     Height: 5\' 7"  (170.2 cm) (stated) Weight: 82.5 kg BMI (Calculated): 28.48   Intake/Output this shift:   Intake/Output Summary (Last 24 hours) at 06/11/2023 1114 Last data filed at 06/11/2023 0545 Gross per 24 hour  Intake 1599.59 ml  Output 0 ml  Net 1599.59 ml    Constitutional :  alert, cooperative, appears stated age, and no distress  Respiratory:  clear to auscultation bilaterally  Cardiovascular:  regular rate and rhythm  Gastrointestinal: Soft, no guarding, minimal focal tenderness to palpation right lower quadrant minimal. .   Skin: Cool and moist.   Psychiatric: Normal affect, non-agitated, not confused       LABS:     Latest Ref Rng & Units 06/11/2023    5:26 AM 06/10/2023    4:55 AM 06/09/2023    9:30 AM  CMP  Glucose 70 - 99 mg/dL 742  85  595   BUN 8 - 23 mg/dL 30  29  29    Creatinine 0.44 - 1.00 mg/dL 6.38  7.56  4.33   Sodium 135 - 145 mmol/L 137  137  135    Potassium 3.5 - 5.1 mmol/L 3.6  3.2  3.5   Chloride 98 - 111 mmol/L 113  111  103   CO2 22 - 32 mmol/L 19  19  23    Calcium 8.9 - 10.3 mg/dL 8.3  8.1  8.5       Latest Ref Rng & Units 06/11/2023    5:26 AM 06/10/2023    4:55 AM 06/09/2023    9:30 AM  CBC  WBC 4.0 - 10.5 K/uL 5.8  5.9  6.0   Hemoglobin 12.0 - 15.0 g/dL 29.5  9.9  18.8   Hematocrit 36.0 - 46.0 % 29.4  29.1  31.5   Platelets 150 - 400 K/uL 163  143  133     RADS: N/A Assessment:   Perforated appendicitis.  Doing well on clears now.  Will advance to full support today and continue to monitor.  Continue IV antibiotics.  labs/images/medications/previous chart entries reviewed personally and relevant changes/updates noted above.

## 2023-06-11 NOTE — TOC Progression Note (Signed)
Transition of Care [facility]) - Progression Note    Patient Details  Name: Jamie [name] MRN: [id number] Date of Birth: February 18, 1961  Transition of Care [facility]) CM/SW Contact  Susa Simmonds, [place] Phone Number: 06/11/2023, 8:51 AM  Clinical Narrative:   There are currently no TOC needs identified at this time. TOC will continue to follow patient and address any needs that may arise.          Expected Discharge Plan and Services                                               Social Determinants of Health (SDOH) Interventions SDOH Screenings   Food Insecurity: No Food Insecurity (06/08/2023)  Housing: Low Risk  (06/08/2023)  Transportation Needs: No Transportation Needs (06/08/2023)  Utilities: Not At Risk (06/08/2023)  Financial Resource Strain: Low Risk  (03/17/2023)   Received from [facility], [facility]  Tobacco Use: Medium Risk (06/08/2023)    Readmission Risk Interventions     No data to display

## 2023-06-12 DIAGNOSIS — K3532 Acute appendicitis with perforation and localized peritonitis, without abscess: Secondary | ICD-10-CM | POA: Diagnosis not present

## 2023-06-12 LAB — BASIC METABOLIC PANEL
Anion gap: 5 (ref 5–15)
BUN: 27 mg/dL — ABNORMAL HIGH (ref 8–23)
CO2: 19 mmol/L — ABNORMAL LOW (ref 22–32)
Calcium: 8.3 mg/dL — ABNORMAL LOW (ref 8.9–10.3)
Chloride: 113 mmol/L — ABNORMAL HIGH (ref 98–111)
Creatinine, Ser: 0.77 mg/dL (ref 0.44–1.00)
GFR, Estimated: >60 mL/min (ref >60)
Glucose, Bld: 103 mg/dL — ABNORMAL HIGH (ref 70–99)
Potassium: 3.5 mmol/L (ref 3.5–5.1)
Sodium: 137 mmol/L (ref 135–145)

## 2023-06-12 LAB — GLUCOSE, CAPILLARY
Glucose-Capillary: 125 mg/dL — ABNORMAL HIGH (ref 70–99)
Glucose-Capillary: 107 mg/dL — ABNORMAL HIGH (ref 70–99)
Glucose-Capillary: 103 mg/dL — ABNORMAL HIGH (ref 70–99)

## 2023-06-12 LAB — CBC
HCT: 28.5 % — ABNORMAL LOW (ref 36.0–46.0)
Hemoglobin: 10.1 g/dL — ABNORMAL LOW (ref 12.0–15.0)
MCH: 30.5 pg (ref 26.0–34.0)
MCHC: 35.4 g/dL (ref 30.0–36.0)
MCV: 86.1 fL (ref 80.0–100.0)
Platelets: 202 10*3/uL (ref 150–400)
RBC: 3.31 MIL/uL — ABNORMAL LOW (ref 3.87–5.11)
RDW: 13.7 % (ref 11.5–15.5)
WBC: 6.7 10*3/uL (ref 4.0–10.5)
nRBC: 0 % (ref 0.0–0.2)

## 2023-06-12 LAB — CULTURE, BLOOD (ROUTINE X 2): Culture: NO GROWTH

## 2023-06-12 MED ORDER — ONDANSETRON 4 MG PO TBDP: 4.0000 mg | ORAL_TABLET | Freq: Four times a day (QID) | ORAL | 0 refills | Status: AC | PRN

## 2023-06-12 MED ORDER — OXYCODONE HCL 5 MG PO TABS: 5.0000 mg | ORAL_TABLET | Freq: Four times a day (QID) | ORAL | 0 refills | Status: DC | PRN

## 2023-06-12 MED ORDER — AMOXICILLIN-POT CLAVULANATE 875-125 MG PO TABS: 1.0000 | ORAL_TABLET | Freq: Two times a day (BID) | ORAL | 0 refills | Status: AC

## 2023-06-12 NOTE — Discharge Summary (Signed)
[facility] SURGICAL DISCHARGE SUMMARY (cpt: [phone])  Patient ID: Jamie [name] MRN: [id number] DOB/AGE: 1961/09/04 62 y.o.  Admit date: 06/08/2023 Discharge date: 06/12/2023  Discharge Diagnoses Patient Active Problem List   Diagnosis Date Noted   Perforated appendicitis 06/08/2023    Consultants None  Procedures None  HPI: 62 y.o. female presented to [facility] ED yesterday for abdominal pain. Patient reports she has been feeling "not well" and fatigued over the course of the last 2 weeks. However, on Tuesday, she developed severe abdominal pain worse in right abdomen. She thought this was constipation and took laxative which resulted in diarrhea. Reports associated nausea. No fever, chills, cough, CP, SOB, emesis. No history of similar. No previous intra-abdominal surgeries. Work up in the ED revealed a normal WBC at 6.6K, Hgb to 12.4, AKI with sCr - 1.76, hypokalemia to 3.4, hyponatremia to 125, and lactic acidosis to 2.5. CT Abdomen/Pelvis concerning for acute appendicitis with inflammatory response in RLQ concerning for perforation.    Hospital Course: Patient was admitted to the general surgery service for conservative management. She responded well to Abx. Diet was restarted on HD2. She remained without leukocytosis. The remainder of patient's hospital course was essentially unremarkable, and discharge planning was initiated accordingly with patient safely able to be discharged home with appropriate discharge instructions, antibiotics (Augmentin x10 days), pain control, and outpatient follow-up after all of her and her family's questions were answered to their expressed satisfaction.   Please Note... Patient with noted PCN allergy. Discussed with patient and her family. Allergy was reportedly a rash when she was a child. Has not had PCN exposure since that time. Discussed trailing Augmen for home understanding that some patient's "grow out of" their PCN allergy with age.  Patient and her daughter are agreeable with this. They understand that if she develops a rash to stop Abx immediately and call the office. Also educated on signs of anaphylaxis including, but not limited to, facial, lips, mouth swelling, trouble breathing.    Discharge Condition: Good   Physical Examination:  Constitutional: Well appearing female, NAD Pulmonary: Normal effort, no respiratory distress Gastrointestinal: Soft, non-tender, non-distended, no rebound./guarding. She is certainly without peritonitis.  Skin: warm, dry     Allergies as of 06/12/2023       Reactions   Clindamycin/lincomycin Rash   Penicillin G Rash   Did it involve swelling of the face/tongue/throat, SOB, or low BP? No Did it involve sudden or severe rash/hives, skin peeling, or any reaction on the inside of your mouth or nose? Yes Did you need to seek medical attention at a hospital or doctor's office? Yes When did it last happen?      when she was younger If all above answers are "NO", may proceed with cephalosporin use.        Medication List     TAKE these medications    amoxicillin-clavulanate 875-125 MG tablet Commonly known as: AUGMENTIN Take 1 tablet by mouth 2 (two) times daily for 10 days.   atorvastatin 40 MG tablet Commonly known as: LIPITOR Take 40 mg by mouth every evening.   Azathioprine 75 MG Tabs Take 1 tablet by mouth daily.   diclofenac 75 MG EC tablet Commonly known as: VOLTAREN Take 75 mg by mouth 2 (two) times daily.   enoxaparin 40 MG/0.4ML injection Commonly known as: LOVENOX Inject 0.4 mLs (40 mg total) into the skin daily for 10 days.   gabapentin 100 MG capsule Commonly known as: NEURONTIN Take 100 mg  by mouth at bedtime.   lisinopril 10 MG tablet Commonly known as: ZESTRIL Take 10 mg by mouth daily.   meloxicam 7.5 MG tablet Commonly known as: MOBIC Take 7.5 mg by mouth daily.   metFORMIN 1000 MG tablet Commonly known as: GLUCOPHAGE Take 1,000 mg by  mouth every evening.   ondansetron 4 MG disintegrating tablet Commonly known as: ZOFRAN-ODT Take 1 tablet (4 mg total) by mouth every 6 (six) hours as needed for nausea.   PARoxetine 10 MG tablet Commonly known as: PAXIL Take 10 mg by mouth every evening.   Vitamin D-1000 Max St 25 MCG (1000 UT) tablet Generic drug: Cholecalciferol Take 1 tablet by mouth daily.          Follow-up Information     Henrene Dodge, MD. Go on 07/03/2023.   Specialty: General Surgery Why: Go to appointment on 08/12 at 915 AM Contact information: [address] [place] [address] [phone]                  Time spent on discharge management including discussion of hospital course, clinical condition, outpatient instructions, prescriptions, and follow up with the patient and members of the medical team: >30 minutes  -- Lynden Oxford , PA-C [facility]  06/12/2023, 10:58 AM [phone] M-F: 7am - 4pm

## 2023-06-12 NOTE — Progress Notes (Signed)
Discharge instructions reviewed with the patient and her daughter, Crystal. IV removed. Patient sent out via wheelchair with belongings

## 2023-06-13 LAB — CULTURE, BLOOD (ROUTINE X 2)

## 2023-07-03 ENCOUNTER — Ambulatory Visit (INDEPENDENT_AMBULATORY_CARE_PROVIDER_SITE_OTHER): Payer: Medicaid Other | Admitting: Surgery

## 2023-07-03 ENCOUNTER — Encounter: Payer: Self-pay | Admitting: Surgery

## 2023-07-03 ENCOUNTER — Ambulatory Visit
Admission: RE | Admit: 2023-07-03 | Discharge: 2023-07-03 | Disposition: A | Discharge status: Home / Self Care | Payer: Medicaid Other | Source: Ambulatory Visit | Attending: Surgery | Admitting: Surgery

## 2023-07-03 VITALS — BP 144/84 | HR 90 | Temp 98.1°F | Ht 66.0 in | Wt 169.2 lb

## 2023-07-03 DIAGNOSIS — R1031 Right lower quadrant pain: Secondary | ICD-10-CM

## 2023-07-03 DIAGNOSIS — K3532 Acute appendicitis with perforation and localized peritonitis, without abscess: Secondary | ICD-10-CM

## 2023-07-03 MED ORDER — IOHEXOL 300 MG/ML  SOLN
100.0000 mL | Freq: Once | INTRAMUSCULAR | Status: AC | PRN
  Administered 2023-07-03: 100 mL via INTRAVENOUS

## 2023-07-03 MED ORDER — AMOXICILLIN-POT CLAVULANATE 875-125 MG PO TABS: 1.0000 | ORAL_TABLET | Freq: Two times a day (BID) | ORAL | 0 refills | Status: AC

## 2023-07-03 MED ORDER — IOHEXOL 9 MG/ML PO SOLN
500.0000 mL | ORAL | Status: AC
  Administered 2023-07-03 (×2): 500 mL via ORAL

## 2023-07-03 NOTE — Progress Notes (Signed)
07/03/2023  History of Present Illness: Jamie [name] is a 62 y.o. female presenting for follow up of perforated appendicitis.  Patient was admitted on 06/08/2023 with perforated appendicitis.  On admission, she had mild lactic acidosis, AKI, with tachycardia, and soft blood pressure.  CT scan showed an enlarged appendix measuring up to 14 mm with surrounding fat stranding with evidence of perforation as seen with extraluminal gas in the right lower quadrant.  No abscess was noted.  She was treated conservatively and recovered well.  She was eventually discharged home on 06/12/2023 with Augmentin course which was completed about 10 days ago.  The patient presents today for follow-up.  She reports that about 2 days ago she started having some more discomfort in the right lower quadrant.  Initially she has been doing very well but more recently there is been some discomfort.  Denies any worsening pain like what brought her to the emergency room.  Denies any fevers, chills, chest pain.  She did have an episode of nausea after eating something greasy.  Of note, she does have a history of cholelithiasis.  Past Medical History: Past Medical History:  Diagnosis Date   Arthritis    Depression    Diabetes (HCC)    High cholesterol    Hypertension    Postmenopausal    LMP 2012     Past Surgical History: Past Surgical History:  Procedure Laterality Date   COLONOSCOPY WITH PROPOFOL N/A 11/16/2022   Procedure: COLONOSCOPY WITH PROPOFOL;  Surgeon: Toledo, Boykin Nearing, MD;  Location: [facility] ENDOSCOPY;  Service: Gastroenterology;  Laterality: N/A;   JOINT REPLACEMENT     TOTAL HIP ARTHROPLASTY Left 09/10/2019   Procedure: TOTAL HIP ARTHROPLASTY ANTERIOR APPROACH;  Surgeon: Kennedy Bucker, MD;  Location: [facility] ORS;  Service: Orthopedics;  Laterality: Left;   TOTAL HIP ARTHROPLASTY Right 12/12/2019   Procedure: RIGHT TOTAL HIP ARTHROPLASTY ANTERIOR APPROACH;  Surgeon: Kennedy Bucker, MD;  Location: [facility] ORS;   Service: Orthopedics;  Laterality: Right;   TUBAL LIGATION      Home Medications: Prior to Admission medications   Medication Sig Start Date End Date Taking? Authorizing Provider  amoxicillin-clavulanate (AUGMENTIN) 875-125 MG tablet Take 1 tablet by mouth 2 (two) times daily for 10 days. 07/03/23 07/13/23 Yes Gizell Danser, Elita Quick, MD  atorvastatin (LIPITOR) 40 MG tablet Take 40 mg by mouth every evening.  04/21/16  Yes [provider]  Azathioprine 75 MG TABS Take 1 tablet by mouth daily.   Yes [provider]  Cholecalciferol (VITAMIN D-1000 MAX ST) 25 MCG (1000 UT) tablet Take 1 tablet by mouth daily.   Yes [provider]  lisinopril (ZESTRIL) 10 MG tablet Take 10 mg by mouth daily.   Yes [provider]  meloxicam (MOBIC) 7.5 MG tablet Take 7.5 mg by mouth daily.   Yes [provider]  metFORMIN (GLUCOPHAGE) 1000 MG tablet Take 1,000 mg by mouth every evening.  04/21/16  Yes [provider]  ondansetron (ZOFRAN-ODT) 4 MG disintegrating tablet Take 1 tablet (4 mg total) by mouth every 6 (six) hours as needed for nausea. 06/12/23  Yes Donovan Kail, PA-C  oxyCODONE (ROXICODONE) 5 MG immediate release tablet Take 1 tablet (5 mg total) by mouth every 6 (six) hours as needed for severe pain. 06/12/23 06/11/24 Yes Shawntay Prest, Elita Quick, MD  PARoxetine (PAXIL) 10 MG tablet Take 10 mg by mouth every evening.    Yes [provider]    Allergies: Allergies  Allergen Reactions   Clindamycin/Lincomycin Rash  Penicillin G Rash    Did it involve swelling of the face/tongue/throat, SOB, or low BP? No Did it involve sudden or severe rash/hives, skin peeling, or any reaction on the inside of your mouth or nose? Yes Did you need to seek medical attention at a hospital or doctor's office? Yes When did it last happen?      when she was younger If all above answers are "NO", may proceed with cephalosporin use.     Review of Systems: Review of Systems   Constitutional:  Negative for chills and fever.  Respiratory:  Negative for shortness of breath.   Cardiovascular:  Negative for chest pain.  Gastrointestinal:  Positive for abdominal pain and nausea. Negative for vomiting.  Genitourinary:  Negative for dysuria.    Physical Exam BP (!) 144/84   Pulse 90   Temp 98.1 F (36.7 C)   Ht 5\' 6"  (1.676 m)   Wt 169 lb 3.2 oz (76.7 kg)   SpO2 99%   BMI 27.31 kg/m  CONSTITUTIONAL: No acute distress, well-nourished HEENT:  Normocephalic, atraumatic, extraocular motion intact. RESPIRATORY:  Normal respiratory effort without pathologic use of accessory muscles. CARDIOVASCULAR: Regular rhythm and rate. GI: The abdomen is soft, nondistended, with some discomfort to the right lower quadrant.  No peritonitis and certainly not tender as she was on her admission.  Negative Murphy's sign. NEUROLOGIC:  Motor and sensation is grossly normal.  Cranial nerves are grossly intact. PSYCH:  Alert and oriented to person, place and time. Affect is normal.  Labs/Imaging: CT abdomen/pelvis on 06/08/2023: IMPRESSION: 1. Findings consistent with acute appendicitis with perforation. No organized abscess. 2. Dilated fluid-filled loops of small bowel likely due to ileus favor ileus over partial obstruction. Thickened terminal ileum with inflammation, likely reactive. 3. Gallstone. 4. Aortic atherosclerosis.  Assessment and Plan: This is a 62 y.o. female with history of perforated appendicitis.  - Discussed with the patient again the rationale for treating her conservatively given the perforation and inflammation surrounding the right lower quadrant.  She did very well in the hospital and was discharged home in good condition.  However the patient reports that about 2 days ago she started having worsening right lower quadrant pain again.  This is not as severe as it was when she first presented to the hospital.  Discussed with her that potentially she could be  developing an abscess or if there is any residual inflammation changes that need a prolonged antibiotic course.  The best way to determine this would be to obtain an urgent CT scan of her abdomen/pelvis with IV contrast at this time in order to better evaluate for any potential complications of her perforated appendicitis episode.  As a precaution, will empirically restart her antibiotic course with Augmentin which worked well last time. - I will contact the patient with the CT scan results to determine further plans.  Discussed with her that it is only some mild inflammatory change, oral antibiotic may be enough, however if there is an abscess, she may require percutaneous drainage versus hospitalization. - The patient understands this plan and all of her questions have been answered.  I spent 30 minutes dedicated to the care of this patient on the date of this encounter to include pre-visit review of records, face-to-face time with the patient discussing diagnosis and management, and any post-visit coordination of care.   Howie Ill, MD [facility]

## 2023-07-03 NOTE — Patient Instructions (Addendum)
We have refilled your Augmentin antibiotics. We get you scheduled for a CT scan for today or tomorrow.  You are scheduled for a CT scan at [facility] on 07/03/23.  You may go over there now. We will call you with the results and any next steps.

## 2023-07-05 NOTE — Progress Notes (Signed)
07/03/23 Called patient immediately after seeing the CT scan results.  Have personally viewed the images and discussed with the patient's daughter.  Mostly seeing residual inflammatory changes, potentially a small abscess vs fistula, but no contrast extravasation noted.  Will treat with 2nd course of antibiotics and follow up with me on 07/17/23.  Return precautions discussed with her daughter.  Henrene Dodge, MD

## 2023-07-17 ENCOUNTER — Encounter: Payer: Self-pay | Admitting: Surgery

## 2023-07-17 ENCOUNTER — Telehealth: Payer: Self-pay | Admitting: Surgery

## 2023-07-17 ENCOUNTER — Ambulatory Visit (INDEPENDENT_AMBULATORY_CARE_PROVIDER_SITE_OTHER): Payer: Medicaid Other | Admitting: Surgery

## 2023-07-17 ENCOUNTER — Telehealth: Payer: Self-pay

## 2023-07-17 VITALS — BP 135/75 | HR 99 | Temp 98.0°F | Ht 66.0 in | Wt 166.4 lb

## 2023-07-17 DIAGNOSIS — K3532 Acute appendicitis with perforation and localized peritonitis, without abscess: Secondary | ICD-10-CM

## 2023-07-17 DIAGNOSIS — K8020 Calculus of gallbladder without cholecystitis without obstruction: Secondary | ICD-10-CM

## 2023-07-17 NOTE — Telephone Encounter (Signed)
Faxed medical clearance to Celena bounvilay PA-C at [phone].

## 2023-07-17 NOTE — Telephone Encounter (Signed)
Patient has been advised of Pre-Admission date/time, and Surgery date at [facility]  Surgery Date: 08/22/23 Preadmission Testing Date: 08/10/23 (phone 8a-1p)  Patient has been made aware to call [phone], between 1-3:00pm the day before surgery, to find out what time to arrive for surgery.

## 2023-07-17 NOTE — Patient Instructions (Addendum)
Your CT is scheduled for 10:30 am (arrive by 8:30 am to drink the contrast) at [facility] on [address]. Only liquids 4 hours prior.   If you have any concerns or questions, please feel free to call our office. See follow up appointment.    Appendicitis, Adult  The appendix is a tube in the body that is shaped like a finger. It is attached to the large intestine. Appendicitis means that this tube is swollen (inflamed). If this is not treated, the tube can tear. This can lead to a life-threatening infection. This condition can also cause pus to build up in the appendix. What are the causes? Something blocking the appendix, such as: A ball of poop (stool). Lymph glands that are bigger than normal. Injury to the belly (abdomen). Sometimes the cause is not known. What increases the risk? You are more likely to get this condition if you are 41-47 years old. What are the signs or symptoms? Pain or tenderness that starts around the belly button and: Moves toward the lower right belly. Gets worse with time. Gets worse if you cough. Gets worse if you make a sudden move. Vomiting or feeling like you may vomit (nauseous). Not wanting to eat as much as normal (loss of appetite). A fever. Trouble pooping (constipation). Watery poop (diarrhea). Feel generally sick (malaise). How is this treated? In general, this condition is treated with both antibiotic medicines and surgery to take out the appendix. If only antibiotics are given and the appendix is not taken out, there is a chance the condition could come back. There are two ways to take out the appendix: Open surgery. For this method, the appendix is taken out through a large cut. This cut is called an incision and is made in the lower right belly. This surgery may be used if: You have scars from another surgery. You have a bleeding condition. You are pregnant and will be having your baby soon. You have a condition that makes it  hard to do the other type of surgery. Laparoscopic surgery. For this method, the appendix is taken out through small cuts. Often, this surgery: Causes less pain. Causes fewer problems. Heals faster. If your appendix tears and pus forms: A drain may be put into the sore. The drain will be used to get rid of the pus. You may get an antibiotic through an IV tube. Your appendix may or may not need to be taken out. Follow these instructions at home: If you had surgery: Follow instructions from your doctor on how to: Care for yourself at home. Take care of your cut from surgery. Medicines Take over-the-counter and prescription medicines only as told by your doctor. If you were prescribed an antibiotic medicine, take it as told by your doctor. Do not stop taking it even if you start to feel better. If told, take steps to prevent problems with pooping (constipation). You may need to: Drink enough fluid to keep your pee (urine) pale yellow. Take medicines. You will be told what medicines to take. Eat foods that are high in fiber. These include beans, whole grains, and fresh fruits and vegetables. Limit foods that are high in fat and sugar. These include fried or sweet foods. Ask your doctor if you should avoid driving or using machines while you are taking your medicine. General instructions Follow instructions from your doctor about what you cannot eat or drink. Do not smoke or use any products that contain nicotine or tobacco. If you  need help quitting, ask your doctor. Return to your normal activities when your doctor says that it is safe. Keep all follow-up visits. Contact a doctor if: There is pus, blood, or a lot of fluid coming from your cut or cuts from surgery. You feel like you may vomit, or you vomit. You have a fever. You are very tired. You have muscle pain. Get help right away if: You have pain in your belly, and the pain is getting worse. You are short of breath. You cannot  stop vomiting. These symptoms may be an emergency. Get help right away. Call your local emergency services (911 in the [place] Do not wait to see if the symptoms will go away. Do not drive yourself to the hospital. Summary Appendicitis is swelling of the appendix. The appendix is a tube that is shaped like a finger. It is joined to the large intestine. This condition may be caused by something that blocks the appendix. This can lead to an infection. This condition is most often treated with both antibiotic medicines and taking out the appendix. This information is not intended to replace advice given to you by your health care provider. Make sure you discuss any questions you have with your health care provider. Document Revised: 04/29/2021 Document Reviewed: 04/29/2021 Elsevier Patient Education  2024 [employer].

## 2023-07-17 NOTE — Addendum Note (Signed)
Addended by: Myrtie Hawk on: 07/17/2023 04:46 PM   Modules accepted: Orders

## 2023-07-17 NOTE — Progress Notes (Signed)
07/17/2023  History of Present Illness: Jamie [name] is a 62 y.o. female presenting for follow up of perforated appendicitis.  She was last seen on 07/03/23 at which time she reported having worsening pain again in the RLQ.  CT scan was done same day which showed improved appendicitis/thickening of the appendix, but there was concern for a small 1.3 cm fluid collection adjacent to the mesentery and terminal ileum concerning for a possible fistula.  The patient was given another course of oral antibiotic which finished last week.  Today, she reports that she is still having some soreness in the right lower quadrant but is mostly depending on her position or level of activity.  She also reports that she has been having issues with her gallbladder.  She does have a history of cholelithiasis and reports that more recently she is also having issues with discomfort in the epigastric area and feeling that the food is getting stuck in her stomach with sensation of nausea.  Has not had any emesis.  These issues have become more frequent in nature.    Past Medical History: Past Medical History:  Diagnosis Date   Arthritis    Depression    Diabetes (HCC)    High cholesterol    Hypertension    Postmenopausal    LMP 2012     Past Surgical History: Past Surgical History:  Procedure Laterality Date   COLONOSCOPY WITH PROPOFOL N/A 11/16/2022   Procedure: COLONOSCOPY WITH PROPOFOL;  Surgeon: Toledo, Boykin Nearing, MD;  Location: [facility] ENDOSCOPY;  Service: Gastroenterology;  Laterality: N/A;   JOINT REPLACEMENT     TOTAL HIP ARTHROPLASTY Left 09/10/2019   Procedure: TOTAL HIP ARTHROPLASTY ANTERIOR APPROACH;  Surgeon: Kennedy Bucker, MD;  Location: [facility] ORS;  Service: Orthopedics;  Laterality: Left;   TOTAL HIP ARTHROPLASTY Right 12/12/2019   Procedure: RIGHT TOTAL HIP ARTHROPLASTY ANTERIOR APPROACH;  Surgeon: Kennedy Bucker, MD;  Location: [facility] ORS;  Service: Orthopedics;  Laterality: Right;   TUBAL LIGATION       Home Medications: Prior to Admission medications   Medication Sig Start Date End Date Taking? Authorizing Provider  atorvastatin (LIPITOR) 40 MG tablet Take 40 mg by mouth every evening.  04/21/16  Yes [provider]  Azathioprine 75 MG TABS Take 1 tablet by mouth daily.   Yes [provider]  Cholecalciferol (VITAMIN D-1000 MAX ST) 25 MCG (1000 UT) tablet Take 1 tablet by mouth daily.   Yes [provider]  lisinopril (ZESTRIL) 10 MG tablet Take 10 mg by mouth daily.   Yes [provider]  meloxicam (MOBIC) 7.5 MG tablet Take 7.5 mg by mouth daily.   Yes [provider]  metFORMIN (GLUCOPHAGE) 1000 MG tablet Take 1,000 mg by mouth every evening.  04/21/16  Yes [provider]  ondansetron (ZOFRAN-ODT) 4 MG disintegrating tablet Take 1 tablet (4 mg total) by mouth every 6 (six) hours as needed for nausea. 06/12/23  Yes Donovan Kail, PA-C  PARoxetine (PAXIL) 10 MG tablet Take 10 mg by mouth every evening.    Yes [provider]    Allergies: Allergies  Allergen Reactions   Clindamycin/Lincomycin Rash   Penicillin G Rash    Did it involve swelling of the face/tongue/throat, SOB, or low BP? No Did it involve sudden or severe rash/hives, skin peeling, or any reaction on the inside of your mouth or nose? Yes Did you need to seek medical attention at a hospital or doctor's office? Yes When did it  last happen?      when she was younger If all above answers are "NO", may proceed with cephalosporin use.     Review of Systems: Review of Systems  Constitutional:  Negative for chills and fever.  Respiratory:  Negative for shortness of breath.   Cardiovascular:  Negative for chest pain.  Gastrointestinal:  Positive for abdominal pain (right side) and nausea. Negative for vomiting.  Genitourinary:  Negative for dysuria.    Physical Exam BP 135/75   Pulse 99   Temp 98 F (36.7 C) (Oral)   Ht 5\' 6"  (1.676 m)   Wt 166 lb  6.4 oz (75.5 kg)   SpO2 97%   BMI 26.86 kg/m  CONSTITUTIONAL: No acute distress, well nourished. HEENT:  Normocephalic, atraumatic, extraocular motion intact. RESPIRATORY:  Lungs are clear, and breath sounds are equal bilaterally. Normal respiratory effort without pathologic use of accessory muscles. CARDIOVASCULAR: Heart is regular without murmurs, gallops, or rubs. GI: The abdomen is soft, non-distended, currently non-tender to palpation. NEUROLOGIC:  Motor and sensation is grossly normal.  Cranial nerves are grossly intact. PSYCH:  Alert and oriented to person, place and time. Affect is normal.  Labs/Imaging: CT abdomen/pelvis on 07/03/2023: IMPRESSION: 1. Improved thickening of the appendix, which is normal in caliber on today's examination. Persistent fat stranding about the appendix and cecal base. 2. Small air and fluid cavity within the adjacent small bowel mesentery measuring 1.3 x 1.3 cm, possibly communicating to the adjacent terminal ileum. 3. Hepatic steatosis. 4. Cholelithiasis.  Assessment and Plan: This is a 62 y.o. female with a history of perforated appendicitis as well as symptomatic cholelithiasis.  - Discussed with the patient that the symptoms that she is having on the right lower quadrant are most likely related to some scar tissue that is pulling depending how she is moving.  On exam today, there is no acute tenderness in the right lower quadrant.  With regards to the epigastric discomfort that she is having, she does have a history of cholelithiasis as noted on ultrasound from 02/04/2022 and can also be seen on her most recent CT scan.  Discussed with her that if indeed she is having gallbladder issues as well, we could potentially combine an appendectomy with cholecystectomy in the same surgery. - However, first I would want to reevaluate her abdomen to make sure that the inflammatory changes particularly between the terminal ileum and the mesentery have resolved to  make sure that there is truly no fistula forming.  Discussed with the patient that if indeed she does have a fistula, this will have to change the type of surgery we do and that instead of just an appendectomy, we would have to do an ileocecectomy.  CT scan will help [place] evaluate this further so there are no surprises and we have a better plan for surgery itself. - Discussed with the patient that in order to allow for all the inflammation from her appendicitis to subside, we will be planning on doing her surgery on 08/22/2023.  This will be a robotic assisted appendectomy versus ileocecectomy as well as a robotic assisted cholecystectomy.  The patient will follow-up with me in about 3 weeks to reevaluate her abdomen and finalize surgical plans and update her H&P.  I spent 30 minutes dedicated to the care of this patient on the date of this encounter to include pre-visit review of records, face-to-face time with the patient discussing diagnosis and management, and any post-visit coordination of care.   [employer]  Maximino Greenland, MD [facility]

## 2023-07-18 ENCOUNTER — Ambulatory Visit
Admission: RE | Admit: 2023-07-18 | Discharge: 2023-07-18 | Disposition: A | Discharge status: Home / Self Care | Payer: Medicaid Other | Source: Ambulatory Visit | Attending: Surgery | Admitting: Surgery

## 2023-07-18 DIAGNOSIS — K3532 Acute appendicitis with perforation and localized peritonitis, without abscess: Secondary | ICD-10-CM | POA: Insufficient documentation

## 2023-07-18 MED ORDER — IOHEXOL 300 MG/ML  SOLN
100.0000 mL | Freq: Once | INTRAMUSCULAR | Status: AC | PRN
  Administered 2023-07-18: 100 mL via INTRAVENOUS

## 2023-07-18 MED ORDER — IOHEXOL 9 MG/ML PO SOLN
500.0000 mL | ORAL | Status: AC
  Administered 2023-07-18: 500 mL via ORAL

## 2023-07-18 MED ORDER — IOHEXOL 9 MG/ML PO SOLN
500.0000 mL | ORAL | Status: AC
  Administered 2023-07-18 (×2): 500 mL via ORAL

## 2023-07-19 MED ORDER — AMOXICILLIN-POT CLAVULANATE 875-125 MG PO TABS: 1.0000 | ORAL_TABLET | Freq: Two times a day (BID) | ORAL | 0 refills | Status: DC

## 2023-07-19 NOTE — Addendum Note (Signed)
Addended by: Myrtie Hawk on: 07/19/2023 02:28 PM   Modules accepted: Orders

## 2023-07-19 NOTE — Progress Notes (Signed)
07/19/23 Images personally viewed.  There's less inflammation changes around the appendix, mostly now at the tip.  The small fluid collection at the distal tip now does not have air within and appears a bit smaller.  No contrast within it to suggest fistulization, which was a concern brought from the previous CT scan.  Potentially the slow healing/persistent issue may be related to AZA that she takes for autoimmune hepatitis.  Will prescribe another course of Augmentin as a precaution.  Patient has follow up with me on 08/07/23 for H&P update for surgery on 08/22/23.  Called her daughter and left voicemail with results and antibiotic information.  Henrene Dodge, MD

## 2023-07-20 NOTE — Progress Notes (Unsigned)
Medical Clearance has been received from [facility], PA-C. The patient is cleared at Medium risk for surgery.

## 2023-07-27 ENCOUNTER — Other Ambulatory Visit (HOSPITAL_COMMUNITY): Payer: Self-pay

## 2023-08-04 ENCOUNTER — Encounter: Payer: Self-pay | Admitting: Surgery

## 2023-08-04 ENCOUNTER — Ambulatory Visit (INDEPENDENT_AMBULATORY_CARE_PROVIDER_SITE_OTHER): Payer: Medicaid Other | Admitting: Surgery

## 2023-08-04 VITALS — BP 130/77 | HR 89 | Temp 98.1°F | Ht 66.0 in | Wt 164.2 lb

## 2023-08-04 DIAGNOSIS — R112 Nausea with vomiting, unspecified: Secondary | ICD-10-CM | POA: Diagnosis not present

## 2023-08-04 DIAGNOSIS — K3532 Acute appendicitis with perforation and localized peritonitis, without abscess: Secondary | ICD-10-CM | POA: Diagnosis not present

## 2023-08-04 DIAGNOSIS — K8020 Calculus of gallbladder without cholecystitis without obstruction: Secondary | ICD-10-CM

## 2023-08-04 DIAGNOSIS — R197 Diarrhea, unspecified: Secondary | ICD-10-CM

## 2023-08-04 NOTE — Progress Notes (Signed)
08/04/2023  History of Present Illness: Jamie [name] is a 62 y.o. female presenting for evaluation of abdominal pain associated with diarrhea, nausea, vomiting.  The patient has a history of perforated appendicitis as well as symptomatic cholelithiasis and she is currently scheduled for robotic assisted appendectomy and cholecystectomy on 08/22/2023.  Patient was originally scheduled for a follow-up with me on 9/16 for H&P update but she called to schedule sooner because she has been having diarrhea and most recently also had episode of nausea and vomiting.  The patient reports that her diarrhea started about a week ago and has been having daily bouts of diarrhea.  She denies any blood in the stool or foul-smelling diarrhea.  She also reports that yesterday she had an episode of nausea and vomiting.  Denies any right upper quadrant pain.  She reports some episodic quick onset and offset of right lower quadrant pain but denies any progressing or worsening pain issues.  When she started with the diarrhea she did have some low pelvic burning sensation.  Past Medical History: Past Medical History:  Diagnosis Date   Arthritis    Depression    Diabetes (HCC)    High cholesterol    Hypertension    Postmenopausal    LMP 2012     Past Surgical History: Past Surgical History:  Procedure Laterality Date   COLONOSCOPY WITH PROPOFOL N/A 11/16/2022   Procedure: COLONOSCOPY WITH PROPOFOL;  Surgeon: Toledo, Boykin Nearing, MD;  Location: [facility];  Service: Gastroenterology;  Laterality: N/A;   JOINT REPLACEMENT     TOTAL HIP ARTHROPLASTY Left 09/10/2019   Procedure: TOTAL HIP ARTHROPLASTY ANTERIOR APPROACH;  Surgeon: Kennedy Bucker, MD;  Location: [facility];  Service: Orthopedics;  Laterality: Left;   TOTAL HIP ARTHROPLASTY Right 12/12/2019   Procedure: RIGHT TOTAL HIP ARTHROPLASTY ANTERIOR APPROACH;  Surgeon: Kennedy Bucker, MD;  Location: [facility];  Service: Orthopedics;  Laterality: Right;   TUBAL  LIGATION      Home Medications: Prior to Admission medications   Medication Sig Start Date End Date Taking? Authorizing Provider  atorvastatin (LIPITOR) 40 MG tablet Take 40 mg by mouth every evening.  04/21/16  Yes [provider]  Azathioprine 75 MG TABS Take 1 tablet by mouth daily.   Yes [provider]  Cholecalciferol (VITAMIN D-1000 MAX ST) 25 MCG (1000 UT) tablet Take 1 tablet by mouth daily.   Yes [provider]  lisinopril (ZESTRIL) 10 MG tablet Take 10 mg by mouth daily.   Yes [provider]  meloxicam (MOBIC) 7.5 MG tablet Take 7.5 mg by mouth daily.   Yes [provider]  metFORMIN (GLUCOPHAGE) 1000 MG tablet Take 1,000 mg by mouth every evening.  04/21/16  Yes [provider]  ondansetron (ZOFRAN-ODT) 4 MG disintegrating tablet Take 1 tablet (4 mg total) by mouth every 6 (six) hours as needed for nausea. 06/12/23  Yes Donovan Kail, PA-C  PARoxetine (PAXIL) 10 MG tablet Take 10 mg by mouth every evening.    Yes [provider]    Allergies: Allergies  Allergen Reactions   Clindamycin/Lincomycin Rash   Penicillin G Rash    Did it involve swelling of the face/tongue/throat, SOB, or low BP? No Did it involve sudden or severe rash/hives, skin peeling, or any reaction on the inside of your mouth or nose? Yes Did you need to seek medical attention at a hospital or doctor's office? Yes When did it last happen?      when she  was younger If all above answers are "NO", may proceed with cephalosporin use.     Review of Systems: Review of Systems  Constitutional:  Negative for chills and fever.  Respiratory:  Negative for shortness of breath.   Cardiovascular:  Negative for chest pain.  Gastrointestinal:  Positive for abdominal pain, diarrhea, nausea and vomiting.    Physical Exam BP 130/77   Pulse 89   Temp 98.1 F (36.7 C) (Oral)   Ht 5\' 6"  (1.676 m)   Wt 164 lb 3.2 oz (74.5 kg)   SpO2 98%   BMI 26.50  kg/m  CONSTITUTIONAL: No acute distress, well-nourished HEENT:  Normocephalic, atraumatic, extraocular motion intact. RESPIRATORY:  Lungs are clear, and breath sounds are equal bilaterally. Normal respiratory effort without pathologic use of accessory muscles. CARDIOVASCULAR: Heart is regular without murmurs, gallops, or rubs. GI: The abdomen is soft, nondistended, currently nontender to palpation.  The patient points to the right lower quadrant at the site of intermittent quick onset/offset pain.  Currently nonperitoneal.  NEUROLOGIC:  Motor and sensation is grossly normal.  Cranial nerves are grossly intact. PSYCH:  Alert and oriented to person, place and time. Affect is normal.  Labs/Imaging: Labs on 07/18/2023: Sodium 138, potassium 4.1, chloride 101, CO2 31, BUN 8, creatinine 0.6.  Total bilirubin 0.4, AST 17, ALT 9, alkaline phosphatase 80, albumin 4.3.  WBC 3.2, hemoglobin 12.2, hematocrit 36, platelets 272.  CT abdomen/pelvis on 07/18/2023: IMPRESSION: 1. Persistent fat stranding in the right lower quadrant about the tip of the appendix, with a small persistent fluid collection adjacent to the tip of the appendix measuring 1.3 x 1.2 cm, with a resolved internal air and fluid level. 2. Large burden of stool throughout the colon and rectum. 3. Hepatic steatosis. Coarse contour of the liver, suggestive of cirrhosis. 4. Cholelithiasis.  Assessment and Plan: This is a 62 y.o. female with perforated appendicitis and symptomatic cholelithiasis.  - Discussed with patient that the nausea and vomiting episode may have been related to an episode of biliary colic or perhaps something that did not settle well that she ate.  She has been recently on antibiotics for her appendicitis and potentially her diarrhea or nausea and vomiting could be related to the antibiotics themselves.  Discussed with her that when taking antibiotics, many patients will get loose stools or diarrhea.  However I would  want to make sure that this is not anything worse and we will also order C. difficile testing for stool sample.  She is currently off antibiotics and so this is more simple diarrhea, discussed with her that this will be improving with time.  Recommend that she can also start taking probiotics to help.  If C. difficile testing is positive, discussed with her that we will need to treat this with a different medication and also may end up delaying her surgery date.  Patient understands this. - Currently, patient is tentatively scheduled for her surgery on 08/22/2023.  Will continue this as planned pending C. difficile testing.  Reviewed the surgery at length again with her including the planned incisions, risks of bleeding, infection, injury to surrounding structures, that this would be still an outpatient procedure, possible drainage requirement, postoperative activity restrictions, pain control, and she is willing to proceed.  I spent 20 minutes dedicated to the care of this patient on the date of this encounter to include pre-visit review of records, face-to-face time with the patient discussing diagnosis and management, and any post-visit coordination of care.  Howie Ill, MD [facility]

## 2023-08-04 NOTE — Patient Instructions (Addendum)
Please provide the lab at [facility] with a stool sample.  If you have any concerns or questions, please feel free to call our office.   Vomiting, Adult Vomiting is when stomach contents forcefully come out of the mouth. Many people notice nausea before vomiting. Vomiting can make you feel weak and cause you to become dehydrated. Dehydration can make you feel tired and thirsty, cause you to have a dry mouth, and decrease how often you urinate. Older adults and people who have other diseases or a weak body defense system (immune system) are at higher risk for dehydration. It is important to treat vomiting as told by your health care provider. Follow these instructions at home:  Watch your symptoms for any changes. Tell your health care provider about them. Eating and drinking     Follow these recommendations as told by your health care provider: Take an oral rehydration solution (ORS). This is a drink that is sold at pharmacies and retail stores. Eat bland, easy-to-digest foods in small amounts as you are able. These foods include bananas, applesauce, rice, lean meats, toast, and crackers. Drink clear fluids slowly and in small amounts as you are able. Clear fluids include water, ice chips, low-calorie sports drinks, and fruit juice that has water added (diluted fruit juice). Avoid drinking fluids that contain a lot of sugar or caffeine, such as energy drinks, sports drinks, and soda. Avoid alcohol. Avoid spicy or fatty foods.  General instructions Wash your hands often using soap and water for at least 20 seconds. If soap and water are not available, use hand sanitizer. Make sure that everyone in your household washes their hands frequently. Take over-the-counter and prescription medicines only as told by your health care provider. Rest at home while you recover. Watch your condition for any changes. Keep all follow-up visits. This is important. Contact a health care provider if: Your  vomiting gets worse. You have new symptoms. You have a fever. You cannot drink fluids without vomiting. You feel light-headed or dizzy. You have a headache. You have muscle cramps. You have a rash. You have pain while urinating. Get help right away if: You have pain in your chest, neck, arm, or jaw. Your heart is beating very quickly. You have trouble breathing or you are breathing very quickly. You feel extremely weak or you faint. Your skin feels cold and clammy. You feel confused. You have persistent vomiting. You have vomit that is bright red or looks like black coffee grounds. You have stools (feces) that are bloody or black, or stools that look like tar. You have a severe headache, a stiff neck, or both. You have severe pain, cramping, or bloating in your abdomen. You have signs of dehydration, such as: Dark urine, very little urine, or no urine. Cracked lips. Dry mouth. Sunken eyes. Sleepiness. Weakness. These symptoms may be an emergency. Get help right away. Call 911. Do not wait to see if the symptoms will go away. Do not drive yourself to the hospital. Summary Vomiting is when stomach contents forcefully come out of the mouth. Vomiting can cause you to become dehydrated. It is important to treat vomiting as told by your health care provider. Follow your health care provider's instructions about eating and drinking. Wash your hands often using soap and water for at least 20 seconds. If soap and water are not available, use hand sanitizer. Watch your condition for any changes and for signs of dehydration. Keep all follow-up visits. This is important. This information  is not intended to replace advice given to you by your health care provider. Make sure you discuss any questions you have with your health care provider. Document Revised: 05/14/2021 Document Reviewed: 05/14/2021 Elsevier Patient Education  2024 [employer].

## 2023-08-04 NOTE — H&P (View-Only) (Signed)
08/04/2023  History of Present Illness: Jamie [name] is a 62 y.o. female presenting for evaluation of abdominal pain associated with diarrhea, nausea, vomiting.  The patient has a history of perforated appendicitis as well as symptomatic cholelithiasis and she is currently scheduled for robotic assisted appendectomy and cholecystectomy on 08/22/2023.  Patient was originally scheduled for a follow-up with me on 9/16 for H&P update but she called to schedule sooner because she has been having diarrhea and most recently also had episode of nausea and vomiting.  The patient reports that her diarrhea started about a week ago and has been having daily bouts of diarrhea.  She denies any blood in the stool or foul-smelling diarrhea.  She also reports that yesterday she had an episode of nausea and vomiting.  Denies any right upper quadrant pain.  She reports some episodic quick onset and offset of right lower quadrant pain but denies any progressing or worsening pain issues.  When she started with the diarrhea she did have some low pelvic burning sensation.  Past Medical History: Past Medical History:  Diagnosis Date   Arthritis    Depression    Diabetes (HCC)    High cholesterol    Hypertension    Postmenopausal    LMP 2012     Past Surgical History: Past Surgical History:  Procedure Laterality Date   COLONOSCOPY WITH PROPOFOL N/A 11/16/2022   Procedure: COLONOSCOPY WITH PROPOFOL;  Surgeon: Toledo, Boykin Nearing, MD;  Location: [facility];  Service: Gastroenterology;  Laterality: N/A;   JOINT REPLACEMENT     TOTAL HIP ARTHROPLASTY Left 09/10/2019   Procedure: TOTAL HIP ARTHROPLASTY ANTERIOR APPROACH;  Surgeon: Kennedy Bucker, MD;  Location: [facility];  Service: Orthopedics;  Laterality: Left;   TOTAL HIP ARTHROPLASTY Right 12/12/2019   Procedure: RIGHT TOTAL HIP ARTHROPLASTY ANTERIOR APPROACH;  Surgeon: Kennedy Bucker, MD;  Location: [facility] OR[facility];  Service: Orthopedics;  Laterality: Right;   TUBAL  LIGATION      Home Medications: Prior to Admission medications   Medication Sig Start Date End Date Taking? Authorizing Provider  atorvastatin (LIPITOR) 40 MG tablet Take 40 mg by mouth every evening.  04/21/16  Yes [provider]  Azathioprine 75 MG TABS Take 1 tablet by mouth daily.   Yes [provider]  Cholecalciferol (VITAMIN D-1000 MAX ST) 25 MCG (1000 UT) tablet Take 1 tablet by mouth daily.   Yes [provider]  lisinopril (ZESTRIL) 10 MG tablet Take 10 mg by mouth daily.   Yes [provider]  meloxicam (MOBIC) 7.5 MG tablet Take 7.5 mg by mouth daily.   Yes [provider]  metFORMIN (GLUCOPHAGE) 1000 MG tablet Take 1,000 mg by mouth every evening.  04/21/16  Yes [provider]  ondansetron (ZOFRAN-ODT) 4 MG disintegrating tablet Take 1 tablet (4 mg total) by mouth every 6 (six) hours as needed for nausea. 06/12/23  Yes Donovan Kail, PA-C  PARoxetine (PAXIL) 10 MG tablet Take 10 mg by mouth every evening.    Yes [provider]    Allergies: Allergies  Allergen Reactions   Clindamycin/Lincomycin Rash   Penicillin G Rash    Did it involve swelling of the face/tongue/throat, SOB, or low BP? No Did it involve sudden or severe rash/hives, skin peeling, or any reaction on the inside of your mouth or nose? Yes Did you need to seek medical attention at a hospital or doctor's office? Yes When did it last happen?      when she  was younger If all above answers are "NO", may proceed with cephalosporin use.     Review of Systems: Review of Systems  Constitutional:  Negative for chills and fever.  Respiratory:  Negative for shortness of breath.   Cardiovascular:  Negative for chest pain.  Gastrointestinal:  Positive for abdominal pain, diarrhea, nausea and vomiting.    Physical Exam BP 130/77   Pulse 89   Temp 98.1 F (36.7 C) (Oral)   Ht 5\' 6"  (1.676 m)   Wt 164 lb 3.2 oz (74.5 kg)   SpO2 98%   BMI 26.50  kg/m  CONSTITUTIONAL: No acute distress, well-nourished HEENT:  Normocephalic, atraumatic, extraocular motion intact. RESPIRATORY:  Lungs are clear, and breath sounds are equal bilaterally. Normal respiratory effort without pathologic use of accessory muscles. CARDIOVASCULAR: Heart is regular without murmurs, gallops, or rubs. GI: The abdomen is soft, nondistended, currently nontender to palpation.  The patient points to the right lower quadrant at the site of intermittent quick onset/offset pain.  Currently nonperitoneal.  NEUROLOGIC:  Motor and sensation is grossly normal.  Cranial nerves are grossly intact. PSYCH:  Alert and oriented to person, place and time. Affect is normal.  Labs/Imaging: Labs on 07/18/2023: Sodium 138, potassium 4.1, chloride 101, CO2 31, BUN 8, creatinine 0.6.  Total bilirubin 0.4, AST 17, ALT 9, alkaline phosphatase 80, albumin 4.3.  WBC 3.2, hemoglobin 12.2, hematocrit 36, platelets 272.  CT abdomen/pelvis on 07/18/2023: IMPRESSION: 1. Persistent fat stranding in the right lower quadrant about the tip of the appendix, with a small persistent fluid collection adjacent to the tip of the appendix measuring 1.3 x 1.2 cm, with a resolved internal air and fluid level. 2. Large burden of stool throughout the colon and rectum. 3. Hepatic steatosis. Coarse contour of the liver, suggestive of cirrhosis. 4. Cholelithiasis.  Assessment and Plan: This is a 62 y.o. female with perforated appendicitis and symptomatic cholelithiasis.  - Discussed with patient that the nausea and vomiting episode may have been related to an episode of biliary colic or perhaps something that did not settle well that she ate.  She has been recently on antibiotics for her appendicitis and potentially her diarrhea or nausea and vomiting could be related to the antibiotics themselves.  Discussed with her that when taking antibiotics, many patients will get loose stools or diarrhea.  However I would  want to make sure that this is not anything worse and we will also order C. difficile testing for stool sample.  She is currently off antibiotics and so this is more simple diarrhea, discussed with her that this will be improving with time.  Recommend that she can also start taking probiotics to help.  If C. difficile testing is positive, discussed with her that we will need to treat this with a different medication and also may end up delaying her surgery date.  Patient understands this. - Currently, patient is tentatively scheduled for her surgery on 08/22/2023.  Will continue this as planned pending C. difficile testing.  Reviewed the surgery at length again with her including the planned incisions, risks of bleeding, infection, injury to surrounding structures, that this would be still an outpatient procedure, possible drainage requirement, postoperative activity restrictions, pain control, and she is willing to proceed.  I spent 20 minutes dedicated to the care of this patient on the date of this encounter to include pre-visit review of records, face-to-face time with the patient discussing diagnosis and management, and any post-visit coordination of care.  Howie Ill, MD [facility]

## 2023-08-07 ENCOUNTER — Ambulatory Visit: Payer: Medicaid Other | Admitting: Surgery

## 2023-08-08 ENCOUNTER — Other Ambulatory Visit
Admission: RE | Admit: 2023-08-08 | Discharge: 2023-08-08 | Disposition: A | Discharge status: Home / Self Care | Payer: Medicaid Other | Source: Ambulatory Visit | Attending: Surgery | Admitting: Surgery

## 2023-08-08 DIAGNOSIS — R197 Diarrhea, unspecified: Secondary | ICD-10-CM | POA: Diagnosis present

## 2023-08-08 LAB — C DIFFICILE QUICK SCREEN W PCR REFLEX
C Diff antigen: NEGATIVE
C Diff interpretation: NOT DETECTED
C Diff toxin: NEGATIVE

## 2023-08-10 ENCOUNTER — Encounter
Admission: RE | Admit: 2023-08-10 | Discharge: 2023-08-10 | Disposition: A | Discharge status: Home / Self Care | Payer: Medicaid Other | Source: Ambulatory Visit | Attending: Surgery | Admitting: Surgery

## 2023-08-10 DIAGNOSIS — Z01818 Encounter for other preprocedural examination: Secondary | ICD-10-CM

## 2023-08-10 HISTORY — DX: Fatty (change of) liver, not elsewhere classified: K76.0

## 2023-08-10 HISTORY — DX: Hypo-osmolality and hyponatremia: E87.1

## 2023-08-10 HISTORY — DX: Anxiety disorder, unspecified: F41.9

## 2023-08-10 HISTORY — DX: Personal history of other endocrine, nutritional and metabolic disease: Z86.39

## 2023-08-10 HISTORY — DX: Hypokalemia: E87.6

## 2023-08-10 HISTORY — DX: Acute appendicitis with perforation, localized peritonitis, and gangrene, without abscess: K35.32

## 2023-08-10 HISTORY — DX: Type 2 diabetes mellitus without complications: E11.9

## 2023-08-10 HISTORY — DX: Other local lupus erythematosus: L93.2

## 2023-08-10 HISTORY — DX: Paresthesia of skin: R20.2

## 2023-08-10 HISTORY — DX: Calculus of gallbladder without cholecystitis without obstruction: K80.20

## 2023-08-10 HISTORY — DX: Autoimmune hepatitis: K75.4

## 2023-08-10 NOTE — Patient Instructions (Addendum)
Your procedure is scheduled on:08-22-23 Tuesday Report to the Registration Desk on the 1st floor of the [facility].Then proceed to the 2nd floor Surgery Desk To find out your arrival time, please call [phone] between 1PM - 3PM on:08-21-23 Monday If your arrival time is 6:00 am, do not arrive before that time as the [facility] entrance doors do not open until 6:00 am.  REMEMBER: Instructions that are not followed completely may result in serious medical risk, up to and including death; or upon the discretion of your surgeon and anesthesiologist your surgery may need to be rescheduled.  Do not eat food after midnight the night before surgery.  No gum chewing or hard candies.  You may however, drink Water up to 2 hours before you are scheduled to arrive for your surgery. Do not drink anything within 2 hours of your scheduled arrival time.  One week prior to surgery:Last dose on 08-14-23 Stop Anti-inflammatories (NSAIDS) such as Advil, Aleve, Ibuprofen, Motrin, Naproxen, Naprosyn and Aspirin based products such as Excedrin, Goody's Powder, BC Powder.You may however, take Tylenol if needed for pain up until the day of surgery. Stop ANY OVER THE COUNTER supplements/vitamins 7 days prior to surgery (Vitamin D)   Continue taking all prescribed medications with the exception of the following: --metFORMIN (GLUCOPHAGE)-Stop 2 days prior to surgery-Last dose will be on 08-19-23 Saturday  TAKE ONLY THESE MEDICATIONS THE MORNING OF SURGERY WITH A SIP OF WATER: -Azathioprine   No Alcohol for 24 hours before or after surgery.  No Smoking including e-cigarettes for 24 hours before surgery.  No chewable tobacco products for at least 6 hours before surgery.  No nicotine patches on the day of surgery.  Do not use any "recreational" drugs for at least a week (preferably 2 weeks) before your surgery.  Please be advised that the combination of cocaine and anesthesia may have negative outcomes, up to  and including death. If you test positive for cocaine, your surgery will be cancelled.  On the morning of surgery brush your teeth with toothpaste and water, you may rinse your mouth with mouthwash if you wish. Do not swallow any toothpaste or mouthwash.  Use CHG Soap as directed on instruction sheet.  Do not wear jewelry, make-up, hairpins, clips or nail polish.  For welded (permanent) jewelry: bracelets, anklets, waist bands, etc.  Please have this removed prior to surgery.  If it is not removed, there is a chance that hospital personnel will need to cut it off on the day of surgery.  Do not wear lotions, powders, or perfumes.   Do not shave body hair from the neck down 48 hours before surgery.  Contact lenses, hearing aids and dentures may not be worn into surgery.  Do not bring valuables to the hospital. [facility] is not responsible for any missing/lost belongings or valuables.   Notify your doctor if there is any change in your medical condition (cold, fever, infection).  Wear comfortable clothing (specific to your surgery type) to the hospital.  After surgery, you can help prevent lung complications by doing breathing exercises.  Take deep breaths and cough every 1-2 hours. Your doctor may order a device called an Incentive Spirometer to help you take deep breaths. When coughing or sneezing, hold a pillow firmly against your incision with both hands. This is called "splinting." Doing this helps protect your incision. It also decreases belly discomfort.  If you are being admitted to the hospital overnight, leave your suitcase in the  car. After surgery it may be brought to your room.  In case of increased patient census, it may be necessary for you, the patient, to continue your postoperative care in the Same Day Surgery department.  If you are being discharged the day of surgery, you will not be allowed to drive home. You will need a responsible individual to drive you home  and stay with you for 24 hours after surgery.   If you are taking public transportation, you will need to have a responsible individual with you.  Please call the Pre-admissions Testing Dept. at [phone] if you have any questions about these instructions.  Surgery Visitation Policy:  Patients having surgery or a procedure may have two visitors.  Children under the age of 84 must have an adult with them who is not the patient.     Preparing for Surgery with CHLORHEXIDINE GLUCONATE (CHG) Soap  Chlorhexidine Gluconate (CHG) Soap  o An antiseptic cleaner that kills germs and bonds with the skin to continue killing germs even after washing  o Used for showering the night before surgery and morning of surgery  Before surgery, you can play an important role by reducing the number of germs on your skin.  CHG (Chlorhexidine gluconate) soap is an antiseptic cleanser which kills germs and bonds with the skin to continue killing germs even after washing.  Please do not use if you have an allergy to CHG or antibacterial soaps. If your skin becomes reddened/irritated stop using the CHG.  1. Shower the NIGHT BEFORE SURGERY and the MORNING OF SURGERY with CHG soap.  2. If you choose to wash your hair, wash your hair first as usual with your normal shampoo.  3. After shampooing, rinse your hair and body thoroughly to remove the shampoo.  4. Use CHG as you would any other liquid soap. You can apply CHG directly to the skin and wash gently with a scrungie or a clean washcloth.  5. Apply the CHG soap to your body only from the neck down. Do not use on open wounds or open sores. Avoid contact with your eyes, ears, mouth, and genitals (private parts). Wash face and genitals (private parts) with your normal soap.  6. Wash thoroughly, paying special attention to the area where your surgery will be performed.  7. Thoroughly rinse your body with warm water.  8. Do not shower/wash with your  normal soap after using and rinsing off the CHG soap.  9. Pat yourself dry with a clean towel.  10. Wear clean pajamas to bed the night before surgery.  12. Place clean sheets on your bed the night of your first shower and do not sleep with pets.  13. Shower again with the CHG soap on the day of surgery prior to arriving at the hospital.  14. Do not apply any deodorants/lotions/powders.  15. Please wear clean clothes to the hospital.

## 2023-08-22 ENCOUNTER — Encounter
Admission: RE | Disposition: A | Discharge status: Home / Self Care | Payer: Self-pay | Source: Home / Self Care | Attending: Surgery

## 2023-08-22 ENCOUNTER — Ambulatory Visit: Payer: Medicaid Other | Admitting: Anesthesiology

## 2023-08-22 ENCOUNTER — Other Ambulatory Visit: Payer: Self-pay

## 2023-08-22 ENCOUNTER — Encounter: Payer: Self-pay | Admitting: Surgery

## 2023-08-22 ENCOUNTER — Ambulatory Visit
Admission: RE | Admit: 2023-08-22 | Discharge: 2023-08-22 | Disposition: A | Discharge status: Home / Self Care | Payer: Medicaid Other | Attending: Surgery | Admitting: Surgery

## 2023-08-22 DIAGNOSIS — K3532 Acute appendicitis with perforation and localized peritonitis, without abscess: Secondary | ICD-10-CM

## 2023-08-22 DIAGNOSIS — K8010 Calculus of gallbladder with chronic cholecystitis without obstruction: Secondary | ICD-10-CM | POA: Diagnosis not present

## 2023-08-22 DIAGNOSIS — K8020 Calculus of gallbladder without cholecystitis without obstruction: Secondary | ICD-10-CM

## 2023-08-22 DIAGNOSIS — I10 Essential (primary) hypertension: Secondary | ICD-10-CM | POA: Insufficient documentation

## 2023-08-22 DIAGNOSIS — R197 Diarrhea, unspecified: Secondary | ICD-10-CM | POA: Diagnosis not present

## 2023-08-22 DIAGNOSIS — K429 Umbilical hernia without obstruction or gangrene: Secondary | ICD-10-CM

## 2023-08-22 DIAGNOSIS — F32A Depression, unspecified: Secondary | ICD-10-CM | POA: Insufficient documentation

## 2023-08-22 DIAGNOSIS — M1990 Unspecified osteoarthritis, unspecified site: Secondary | ICD-10-CM | POA: Insufficient documentation

## 2023-08-22 DIAGNOSIS — Z87891 Personal history of nicotine dependence: Secondary | ICD-10-CM | POA: Diagnosis not present

## 2023-08-22 DIAGNOSIS — Z01818 Encounter for other preprocedural examination: Secondary | ICD-10-CM

## 2023-08-22 DIAGNOSIS — E119 Type 2 diabetes mellitus without complications: Secondary | ICD-10-CM | POA: Diagnosis not present

## 2023-08-22 DIAGNOSIS — K3589 Other acute appendicitis without perforation or gangrene: Secondary | ICD-10-CM | POA: Diagnosis present

## 2023-08-22 HISTORY — PX: UMBILICAL HERNIA REPAIR: SHX196

## 2023-08-22 HISTORY — PX: XI ROBOTIC LAPAROSCOPIC ASSISTED APPENDECTOMY: SHX6877

## 2023-08-22 LAB — GLUCOSE, CAPILLARY: Glucose-Capillary: 115 mg/dL — ABNORMAL HIGH (ref 70–99)

## 2023-08-22 SURGERY — APPENDECTOMY, ROBOT-ASSISTED, LAPAROSCOPIC
Anesthesia: General

## 2023-08-22 MED ORDER — INDOCYANINE GREEN 25 MG IV SOLR
INTRAVENOUS | Status: AC
  Filled 2023-08-22: qty 10

## 2023-08-22 MED ORDER — LIDOCAINE HCL (CARDIAC) PF 100 MG/5ML IV SOSY
PREFILLED_SYRINGE | INTRAVENOUS | Status: DC | PRN
  Administered 2023-08-22: 60 mg via INTRAVENOUS

## 2023-08-22 MED ORDER — FENTANYL CITRATE (PF) 100 MCG/2ML IJ SOLN
INTRAMUSCULAR | Status: AC
  Filled 2023-08-22: qty 2

## 2023-08-22 MED ORDER — PHENYLEPHRINE HCL-NACL 20-0.9 MG/250ML-% IV SOLN
INTRAVENOUS | Status: AC
  Filled 2023-08-22: qty 250

## 2023-08-22 MED ORDER — BUPIVACAINE LIPOSOME 1.3 % IJ SUSP: 20.0000 mL | Freq: Once | INTRAMUSCULAR | Status: DC

## 2023-08-22 MED ORDER — ORAL CARE MOUTH RINSE: 15.0000 mL | Freq: Once | OROMUCOSAL | Status: AC

## 2023-08-22 MED ORDER — BUPIVACAINE-EPINEPHRINE (PF) 0.25% -1:200000 IJ SOLN
INTRAMUSCULAR | Status: AC
  Filled 2023-08-22: qty 30

## 2023-08-22 MED ORDER — CHLORHEXIDINE GLUCONATE 0.12 % MT SOLN
15.0000 mL | Freq: Once | OROMUCOSAL | Status: AC
  Administered 2023-08-22: 15 mL via OROMUCOSAL

## 2023-08-22 MED ORDER — FENTANYL CITRATE (PF) 100 MCG/2ML IJ SOLN: 25.0000 ug | INTRAMUSCULAR | Status: DC | PRN

## 2023-08-22 MED ORDER — BUPIVACAINE-EPINEPHRINE (PF) 0.5% -1:200000 IJ SOLN
INTRAMUSCULAR | Status: AC
  Filled 2023-08-22: qty 30

## 2023-08-22 MED ORDER — CHLORHEXIDINE GLUCONATE CLOTH 2 % EX PADS
6.0000 | MEDICATED_PAD | Freq: Once | CUTANEOUS | Status: AC
  Administered 2023-08-22: 6 via TOPICAL

## 2023-08-22 MED ORDER — IBUPROFEN 600 MG PO TABS: 600.0000 mg | ORAL_TABLET | Freq: Three times a day (TID) | ORAL | 1 refills | Status: AC | PRN

## 2023-08-22 MED ORDER — MIDAZOLAM HCL 2 MG/2ML IJ SOLN
INTRAMUSCULAR | Status: AC
  Filled 2023-08-22: qty 2

## 2023-08-22 MED ORDER — BUPIVACAINE-EPINEPHRINE (PF) 0.5% -1:200000 IJ SOLN
INTRAMUSCULAR | Status: DC | PRN
  Administered 2023-08-22: 30 mL

## 2023-08-22 MED ORDER — CHLORHEXIDINE GLUCONATE 0.12 % MT SOLN
OROMUCOSAL | Status: AC
  Filled 2023-08-22: qty 15

## 2023-08-22 MED ORDER — PROPOFOL 10 MG/ML IV BOLUS
INTRAVENOUS | Status: AC
  Filled 2023-08-22: qty 20

## 2023-08-22 MED ORDER — GABAPENTIN 300 MG PO CAPS
ORAL_CAPSULE | ORAL | Status: AC
  Filled 2023-08-22: qty 1

## 2023-08-22 MED ORDER — ONDANSETRON HCL 4 MG/2ML IJ SOLN: 4.0000 mg | Freq: Once | INTRAMUSCULAR | Status: DC | PRN

## 2023-08-22 MED ORDER — ACETAMINOPHEN 10 MG/ML IV SOLN
INTRAVENOUS | Status: AC
  Filled 2023-08-22: qty 100

## 2023-08-22 MED ORDER — SODIUM CHLORIDE 0.9 % IR SOLN
Status: DC | PRN
  Administered 2023-08-22: 1

## 2023-08-22 MED ORDER — LACTATED RINGERS IV SOLN: INTRAVENOUS | Status: DC | PRN

## 2023-08-22 MED ORDER — PROPOFOL 10 MG/ML IV BOLUS
INTRAVENOUS | Status: DC | PRN
Start: 2023-08-22 — End: 2023-08-22
  Administered 2023-08-22: 100 mg via INTRAVENOUS

## 2023-08-22 MED ORDER — ONDANSETRON HCL 4 MG/2ML IJ SOLN
INTRAMUSCULAR | Status: DC | PRN
  Administered 2023-08-22: 4 mg via INTRAVENOUS

## 2023-08-22 MED ORDER — FENTANYL CITRATE (PF) 100 MCG/2ML IJ SOLN
INTRAMUSCULAR | Status: DC | PRN
  Administered 2023-08-22 (×2): 50 ug via INTRAVENOUS

## 2023-08-22 MED ORDER — BUPIVACAINE LIPOSOME 1.3 % IJ SUSP
INTRAMUSCULAR | Status: DC | PRN
  Administered 2023-08-22: 10 mL

## 2023-08-22 MED ORDER — CEFAZOLIN SODIUM-DEXTROSE 2-4 GM/100ML-% IV SOLN
2.0000 g | INTRAVENOUS | Status: AC
  Administered 2023-08-22: 2 g via INTRAVENOUS

## 2023-08-22 MED ORDER — LACTATED RINGERS IV SOLN: INTRAVENOUS | Status: DC

## 2023-08-22 MED ORDER — OXYCODONE HCL 5 MG/5ML PO SOLN: 5.0000 mg | Freq: Once | ORAL | Status: DC | PRN

## 2023-08-22 MED ORDER — MIDAZOLAM HCL 2 MG/2ML IJ SOLN
INTRAMUSCULAR | Status: DC | PRN
  Administered 2023-08-22: 2 mg via INTRAVENOUS

## 2023-08-22 MED ORDER — OXYCODONE HCL 5 MG PO TABS: 5.0000 mg | ORAL_TABLET | Freq: Once | ORAL | Status: DC | PRN

## 2023-08-22 MED ORDER — PHENYLEPHRINE HCL-NACL 20-0.9 MG/250ML-% IV SOLN
INTRAVENOUS | Status: DC | PRN
Start: 2023-08-22 — End: 2023-08-22
  Administered 2023-08-22: 20 ug/min via INTRAVENOUS

## 2023-08-22 MED ORDER — FAMOTIDINE 20 MG PO TABS
20.0000 mg | ORAL_TABLET | Freq: Once | ORAL | Status: AC
  Administered 2023-08-22: 20 mg via ORAL

## 2023-08-22 MED ORDER — ROCURONIUM BROMIDE 100 MG/10ML IV SOLN
INTRAVENOUS | Status: DC | PRN
  Administered 2023-08-22: 50 mg via INTRAVENOUS
  Administered 2023-08-22 (×2): 20 mg via INTRAVENOUS

## 2023-08-22 MED ORDER — SUGAMMADEX SODIUM 200 MG/2ML IV SOLN
INTRAVENOUS | Status: DC | PRN
  Administered 2023-08-22: 200 mg via INTRAVENOUS

## 2023-08-22 MED ORDER — CEFAZOLIN SODIUM-DEXTROSE 2-4 GM/100ML-% IV SOLN
INTRAVENOUS | Status: AC
  Filled 2023-08-22: qty 100

## 2023-08-22 MED ORDER — OXYCODONE HCL 5 MG PO TABS: 5.0000 mg | ORAL_TABLET | ORAL | 0 refills | Status: AC | PRN

## 2023-08-22 MED ORDER — ACETAMINOPHEN 500 MG PO TABS
1000.0000 mg | ORAL_TABLET | ORAL | Status: AC
  Administered 2023-08-22: 1000 mg via ORAL

## 2023-08-22 MED ORDER — KETOROLAC TROMETHAMINE 30 MG/ML IJ SOLN
INTRAMUSCULAR | Status: DC | PRN
  Administered 2023-08-22: 15 mg via INTRAVENOUS

## 2023-08-22 MED ORDER — HYDROMORPHONE HCL 1 MG/ML IJ SOLN
INTRAMUSCULAR | Status: AC
  Filled 2023-08-22: qty 1

## 2023-08-22 MED ORDER — DEXAMETHASONE SODIUM PHOSPHATE 10 MG/ML IJ SOLN
INTRAMUSCULAR | Status: DC | PRN
  Administered 2023-08-22: 5 mg via INTRAVENOUS

## 2023-08-22 MED ORDER — GABAPENTIN 300 MG PO CAPS
300.0000 mg | ORAL_CAPSULE | ORAL | Status: AC
  Administered 2023-08-22: 300 mg via ORAL

## 2023-08-22 MED ORDER — ACETAMINOPHEN 500 MG PO TABS
ORAL_TABLET | ORAL | Status: AC
  Filled 2023-08-22: qty 2

## 2023-08-22 MED ORDER — BUPIVACAINE LIPOSOME 1.3 % IJ SUSP
INTRAMUSCULAR | Status: AC
  Filled 2023-08-22: qty 20

## 2023-08-22 MED ORDER — SODIUM CHLORIDE 0.9 % IV SOLN: INTRAVENOUS | Status: DC

## 2023-08-22 MED ORDER — PHENYLEPHRINE HCL (PRESSORS) 10 MG/ML IV SOLN
INTRAVENOUS | Status: DC | PRN
  Administered 2023-08-22 (×2): 160 ug via INTRAVENOUS
  Administered 2023-08-22: 80 ug via INTRAVENOUS
  Administered 2023-08-22: 160 ug via INTRAVENOUS
  Administered 2023-08-22: 80 ug via INTRAVENOUS

## 2023-08-22 MED ORDER — ACETAMINOPHEN 500 MG PO TABS: 1000.0000 mg | ORAL_TABLET | Freq: Four times a day (QID) | ORAL | Status: AC | PRN

## 2023-08-22 MED ORDER — INDOCYANINE GREEN 25 MG IV SOLR
2.5000 mg | INTRAVENOUS | Status: AC
  Administered 2023-08-22: 2.5 mg via INTRAVENOUS

## 2023-08-22 MED ORDER — ACETAMINOPHEN 10 MG/ML IV SOLN: 1000.0000 mg | Freq: Once | INTRAVENOUS | Status: DC | PRN

## 2023-08-22 MED ORDER — FAMOTIDINE 20 MG PO TABS
ORAL_TABLET | ORAL | Status: AC
  Filled 2023-08-22: qty 1

## 2023-08-22 MED ORDER — HYDROMORPHONE HCL 1 MG/ML IJ SOLN
INTRAMUSCULAR | Status: DC | PRN
Start: 2023-08-22 — End: 2023-08-22
  Administered 2023-08-22 (×2): .5 mg via INTRAVENOUS

## 2023-08-22 SURGICAL SUPPLY — 61 items
ADH SKN CLS APL DERMABOND .7 (GAUZE/BANDAGES/DRESSINGS) ×2
BAG PRESSURE INF REUSE 1000 (BAG) IMPLANT
BLADE SURG SZ11 CARB STEEL (BLADE) ×2 IMPLANT
CANNULA CAP OBTURATR AIRSEAL 8 (CAP) IMPLANT
CAUTERY HOOK MNPLR 1.6 DVNC XI (INSTRUMENTS) ×2 IMPLANT
CLIP LIGATING HEMO O LOK GREEN (MISCELLANEOUS) ×2 IMPLANT
COVER WAND RF STERILE (DRAPES) ×2 IMPLANT
DERMABOND ADVANCED .7 DNX12 (GAUZE/BANDAGES/DRESSINGS) ×2 IMPLANT
DRAPE ARM DVNC X/XI (DISPOSABLE) ×8 IMPLANT
DRAPE COLUMN DVNC XI (DISPOSABLE) ×2 IMPLANT
ELECT CAUTERY BLADE TIP 2.5 (TIP) ×2
ELECT REM PT RETURN 9FT ADLT (ELECTROSURGICAL) ×2
ELECTRODE CAUTERY BLDE TIP 2.5 (TIP) ×2 IMPLANT
ELECTRODE REM PT RTRN 9FT ADLT (ELECTROSURGICAL) ×2 IMPLANT
FORCEPS BPLR R/ABLATION 8 DVNC (INSTRUMENTS) ×2 IMPLANT
FORCEPS PROGRASP DVNC XI (FORCEP) ×2 IMPLANT
GLOVE SURG SYN 7.0 (GLOVE) ×6
GLOVE SURG SYN 7.0 PF PI (GLOVE) ×4 IMPLANT
GLOVE SURG SYN 7.5 E (GLOVE) ×6
GLOVE SURG SYN 7.5 PF PI (GLOVE) ×4 IMPLANT
GOWN STRL REUS W/ TWL LRG LVL3 (GOWN DISPOSABLE) ×8 IMPLANT
GOWN STRL REUS W/TWL LRG LVL3 (GOWN DISPOSABLE) ×6
GRASPER SUT TROCAR 14GX15 (MISCELLANEOUS) IMPLANT
IRRIGATION STRYKERFLOW (MISCELLANEOUS) IMPLANT
IRRIGATOR STRYKERFLOW (MISCELLANEOUS) ×2
IRRIGATOR SUCT 8 DISP DVNC XI (IRRIGATION / IRRIGATOR) IMPLANT
IV NS 1000ML (IV SOLUTION)
IV NS 1000ML BAXH (IV SOLUTION) IMPLANT
KIT PINK PAD W/HEAD ARE REST (MISCELLANEOUS) ×2
KIT PINK PAD W/HEAD ARM REST (MISCELLANEOUS) ×2 IMPLANT
LABEL OR SOLS (LABEL) ×2 IMPLANT
MANIFOLD NEPTUNE II (INSTRUMENTS) ×2 IMPLANT
NDL HYPO 22X1.5 SAFETY MO (MISCELLANEOUS) ×2 IMPLANT
NDL INSUFFLATION 14GA 120MM (NEEDLE) ×2 IMPLANT
NEEDLE HYPO 22X1.5 SAFETY MO (MISCELLANEOUS) ×2
NEEDLE INSUFFLATION 14GA 120MM (NEEDLE) ×2
NS IRRIG 500ML POUR BTL (IV SOLUTION) ×2 IMPLANT
OBTURATOR OPTICAL STND 8 DVNC (TROCAR) ×2
OBTURATOR OPTICALSTD 8 DVNC (TROCAR) ×2 IMPLANT
PACK LAP CHOLECYSTECTOMY (MISCELLANEOUS) ×2 IMPLANT
PENCIL SMOKE EVACUATOR (MISCELLANEOUS) ×2 IMPLANT
RELOAD STAPLE 45 3.5 BLU DVNC (STAPLE) IMPLANT
SEAL UNIV 5-12 XI (MISCELLANEOUS) ×8 IMPLANT
SEALER VESSEL EXT DVNC XI (MISCELLANEOUS) ×2 IMPLANT
SET TUBE FILTERED XL AIRSEAL (SET/KITS/TRAYS/PACK) IMPLANT
SET TUBE SMOKE EVAC HIGH FLOW (TUBING) ×2 IMPLANT
SOL ELECTROSURG ANTI STICK (MISCELLANEOUS) ×2
SOLUTION ELECTROSURG ANTI STCK (MISCELLANEOUS) ×2 IMPLANT
SPIKE FLUID TRANSFER (MISCELLANEOUS) ×2 IMPLANT
STAPLER 45 SUREFORM DVNC (STAPLE) IMPLANT
STAPLER RELOAD 3.5X45 BLU DVNC (STAPLE) ×4
SUT MNCRL AB 4-0 PS2 18 (SUTURE) ×2 IMPLANT
SUT VIC AB 2-0 SH 27 (SUTURE) ×2
SUT VIC AB 2-0 SH 27XBRD (SUTURE) IMPLANT
SUT VIC AB 3-0 SH 27 (SUTURE) ×2
SUT VIC AB 3-0 SH 27X BRD (SUTURE) ×2 IMPLANT
SUT VICRYL 0 UR6 27IN ABS (SUTURE) ×4 IMPLANT
SYS BAG RETRIEVAL 10MM (BASKET) ×2
SYSTEM BAG RETRIEVAL 10MM (BASKET) ×2 IMPLANT
TRAY FOLEY MTR SLVR 16FR STAT (SET/KITS/TRAYS/PACK) ×2 IMPLANT
WATER STERILE IRR 500ML POUR (IV SOLUTION) ×2 IMPLANT

## 2023-08-22 NOTE — Transfer of Care (Signed)
Immediate Anesthesia Transfer of Care Note  Patient: Jamie [name]  Procedure(s) Performed: XI ROBOTIC LAPAROSCOPIC ASSISTED APPENDECTOMY XI ROBOTIC ASSISTED LAPAROSCOPIC CHOLECYSTECTOMY INDOCYANINE GREEN FLUORESCENCE IMAGING (ICG) HERNIA REPAIR UMBILICAL ADULT  Patient Location: PACU  Anesthesia Type:General  Level of Consciousness: drowsy  Airway & Oxygen Therapy: Patient Spontanous Breathing and Patient connected to face mask oxygen  Post-op Assessment: Report given to RN and Post -op Vital signs reviewed and stable  Post vital signs: Reviewed  Last Vitals:  Vitals Value Taken Time  BP 129/74 08/22/23 1030  Temp 110F   Pulse 87 08/22/23 1031  Resp 17 08/22/23 1031  SpO2 100 % 08/22/23 1031  Vitals shown include unfiled device data.  Last Pain:  Vitals:   08/22/23 0620  TempSrc: Oral  PainSc: 0-No pain         Complications: No notable events documented.

## 2023-08-22 NOTE — Op Note (Signed)
Procedure Date:  08/22/2023  Pre-operative Diagnosis:  Acute appendicitis, symptomatic cholelithiasis  Post-operative Diagnosis: Acute appendicitis, symptomatic cholelithiasis, reducible umbilical hernia 1 cm.  Procedure:  Robotic assisted cholecystectomy with ICG FireFly cholangiogram; robotic assisted appendectomy; open umbilical hernia repair  Surgeon:  Howie Ill, MD  Anesthesia:  General endotracheal  Estimated Blood Loss:  10 ml  Specimens:  gallbladder and appendix  Complications:  None  Indications for Procedure:  This is a 62 y.o. female with a history of acute perforated appendicitis, which was treated conservatively.  She now presents for interval appendectomy.  She also has symptomatic cholelithiasis and also presents for cholecystectomy.  The benefits, complications, treatment options, and expected outcomes were discussed with the patient. The risks of bleeding, infection, recurrence of symptoms, failure to resolve symptoms, bile duct damage, bile duct leak, retained common bile duct stone, bowel injury, and need for further procedures were all discussed with the patient and she was willing to proceed.  Description of Procedure: The patient was correctly identified in the preoperative area and brought into the operating room.  The patient was placed supine with VTE prophylaxis in place.  Appropriate time-outs were performed.  Anesthesia was induced and the patient was intubated.  Appropriate antibiotics were infused.  The abdomen was prepped and draped in a sterile fashion. An infraumbilical incision was made. Cautery was used to dissect along the umbilical stalk.  The stalk was separated from the fascia, revealing a 1 cm hernia defect.  The fascial edges were cleared with cautery, and a 12 mm robotic port was inserted.  Pneumoperitoneum was obtained with appropriate opening pressures.  Three 8-mm ports were placed in the mid abdomen at the level of the umbilicus under  direct visualization.  The DaVinci platform was docked, camera targeted, and instruments were placed under direct visualization.  We started with the appendectomy portion.  There were adhesions from the omentum to the right anterior abdominal wall and to the appendix and colon.  These were taken down cautiously with cautery.  Then, the appendix was visualized and dissected.  The mesoappendix was dissected with combination of bipolar instrument and cautery.  Then, the appendix was resected using a blue load 45 mm stapler at its base.  The appendix was then placed in the right upper quadrant.  The DaVinci platform was undocked and then rotated and redocked.  The camera was again targeted and instruments placed.  The gallbladder was identified.  The fundus was grasped and retracted cephalad.  Adhesions were lysed bluntly and with electrocautery. The infundibulum was grasped and retracted laterally, exposing the peritoneum overlying the gallbladder.  This was incised with electrocautery and extended on either side of the gallbladder.  FireFly cholangiogram was then obtained, and we were able to clearly identify the cystic duct and common bile duct.  The cystic duct and cystic artery were carefully dissected with combination of cautery and blunt dissection.  Both were clipped twice proximally and once distally, cutting in between.  The gallbladder was taken from the gallbladder fossa in a retrograde fashion with electrocautery. The gallbladder and appendix were placed in an Endocatch bag. The liver bed was inspected and any bleeding was controlled with electrocautery. The right upper quadrant was then inspected again revealing intact clips, no bleeding, and no ductal injury.    The DaVinci platform was undocked.  Stryker irrigator was used to irrigate and suction both right upper quadrant and right lower quadrant.  The 8 mm ports were removed under direct visualization  and the 12 mm port was removed.  The  Endocatch bag was brought out via the umbilical incision. The hernia defect was closed using 0 vicryl sutures.  Local anesthetic was infused in all incisions and fascia.  The stalk was reattached using 2-0 Vicryl suture, and the umbilical incision was closed with 3-0 Vicryl and 4-0 Monocryl.  The remaining port incisions were closed with 4-0 Monocryl.  The wounds were cleaned and sealed with DermaBond.  The patient was emerged from anesthesia and extubated and brought to the recovery room for further management.  The patient tolerated the procedure well and all counts were correct at the end of the case.   Howie Ill, MD

## 2023-08-22 NOTE — Anesthesia Procedure Notes (Signed)
Procedure Name: Intubation Date/Time: 08/22/2023 7:42 AM  Performed by: Merlene Pulling, CRNAPre-anesthesia Checklist: Patient identified, Patient being monitored, Timeout performed, Emergency Drugs available and Suction available Patient Re-evaluated:Patient Re-evaluated prior to induction Oxygen Delivery Method: Circle system utilized Preoxygenation: Pre-oxygenation with 100% oxygen Induction Type: IV induction Ventilation: Mask ventilation without difficulty Laryngoscope Size: 3 and McGraph Grade View: Grade I Tube type: Oral Tube size: 7.0 mm Number of attempts: 1 Airway Equipment and Method: Stylet Placement Confirmation: ETT inserted through vocal cords under direct vision, positive ETCO2 and breath sounds checked- equal and bilateral Secured at: 19 cm Tube secured with: Tape Dental Injury: Teeth and Oropharynx as per pre-operative assessment

## 2023-08-22 NOTE — Anesthesia Postprocedure Evaluation (Signed)
Anesthesia Post Note  Patient: Jamie [name]  Procedure(s) Performed: XI ROBOTIC LAPAROSCOPIC ASSISTED APPENDECTOMY XI ROBOTIC ASSISTED LAPAROSCOPIC CHOLECYSTECTOMY INDOCYANINE GREEN FLUORESCENCE IMAGING (ICG) HERNIA REPAIR UMBILICAL ADULT  Patient location during evaluation: PACU Anesthesia Type: General Level of consciousness: awake and alert, oriented and patient cooperative Pain management: pain level controlled Vital Signs Assessment: post-procedure vital signs reviewed and stable Respiratory status: spontaneous breathing, nonlabored ventilation and respiratory function stable Cardiovascular status: blood pressure returned to baseline and stable Postop Assessment: adequate PO intake Anesthetic complications: no   No notable events documented.   Last Vitals:  Vitals:   08/22/23 1110 08/22/23 1127  BP:  126/68  Pulse: 72   Resp: 10 18  Temp: (!) 36.1 C   SpO2: 96% 98%    Last Pain:  Vitals:   08/22/23 1100  TempSrc:   PainSc: 0-No pain                 Reed Breech

## 2023-08-22 NOTE — Discharge Instructions (Addendum)
Discharge Instructions: 1.  Patient may shower, but do not scrub wounds heavily and dab dry only. 2.  Do not submerge wounds in pool/tub until fully healed. 3.  Do not apply ointments or hydrogen peroxide to the wounds. 4.  May apply ice packs to the wounds for comfort. 5.  Do not drive while taking narcotics for pain control.  Prior to driving, make sure you are able to rotate right and left to look at blindspots without significant pain or discomfort. 6.  No heavy lifting or pushing of more than 10-15 lbs for 4 weeks.   AMBULATORY SURGERY  DISCHARGE INSTRUCTIONS   The drugs that you were given will stay in your system until tomorrow so for the next 24 hours you should not:  Drive an automobile Make any legal decisions Drink any alcoholic beverage   You may resume regular meals tomorrow.  Today it is better to start with liquids and gradually work up to solid foods.  You may eat anything you prefer, but it is better to start with liquids, then soup and crackers, and gradually work up to solid foods.   Please notify your doctor immediately if you have any unusual bleeding, trouble breathing, redness and pain at the surgery site, drainage, fever, or pain not relieved by medication.    Additional Instructions:   Please contact your physician with any problems or Same Day Surgery at [phone], Monday through Friday 6 am to 4 pm, or [facility] number at [phone].  

## 2023-08-22 NOTE — Interval H&P Note (Signed)
History and Physical Interval Note:  08/22/2023 7:09 AM  Jamie [name]  has presented today for surgery, with the diagnosis of perforated appendicitis, symptomatic cholelithiasis.  The various methods of treatment have been discussed with the patient and family. After consideration of risks, benefits and other options for treatment, the patient has consented to  Procedure(s): XI ROBOTIC LAPAROSCOPIC ASSISTED APPENDECTOMY (N/A) XI ROBOTIC ASSISTED LAPAROSCOPIC CHOLECYSTECTOMY (N/A) INDOCYANINE GREEN FLUORESCENCE IMAGING (ICG) (N/A) as a surgical intervention.  The patient's history has been reviewed, patient examined, no change in status, stable for surgery.  I have reviewed the patient's chart and labs.  Questions were answered to the patient's satisfaction.     Jamie [name]

## 2023-08-22 NOTE — Anesthesia Preprocedure Evaluation (Addendum)
Anesthesia Evaluation  Patient identified by MRN, date of birth, ID band Patient awake    Reviewed: Allergy & Precautions, NPO status , Patient's Chart, lab work & pertinent test results  History of Anesthesia Complications Negative for: history of anesthetic complications  Airway Mallampati: III   Neck ROM: Full    Dental  (+) Edentulous Upper, Edentulous Lower   Pulmonary former smoker (quit 10 years ago)   Pulmonary exam normal breath sounds clear to auscultation       Cardiovascular hypertension, Normal cardiovascular exam Rhythm:Regular Rate:Normal  ECG 06/08/23: ST, rightward axis   Neuro/Psych  PSYCHIATRIC DISORDERS  Depression    negative neurological ROS     GI/Hepatic negative GI ROS,,,  Endo/Other  diabetes, Type 2    Renal/GU negative Renal ROS     Musculoskeletal  (+) Arthritis ,    Abdominal   Peds  Hematology negative hematology ROS (+)   Anesthesia Other Findings   Reproductive/Obstetrics                             Anesthesia Physical Anesthesia Plan  ASA: 2  Anesthesia Plan: General   Post-op Pain Management:    Induction: Intravenous  PONV Risk Score and Plan: 3 and Ondansetron, Dexamethasone and Treatment may vary due to age or medical condition  Airway Management Planned: Oral ETT  Additional Equipment:   Intra-op Plan:   Post-operative Plan: Extubation in OR  Informed Consent: I have reviewed the patients History and Physical, chart, labs and discussed the procedure including the risks, benefits and alternatives for the proposed anesthesia with the patient or authorized representative who has indicated his/her understanding and acceptance.     Dental advisory given  Plan Discussed with: CRNA  Anesthesia Plan Comments: (Patient consented for risks of anesthesia including but not limited to:  - adverse reactions to medications - damage to eyes,  teeth, lips or other oral mucosa - nerve damage due to positioning  - sore throat or hoarseness - damage to heart, brain, nerves, lungs, other parts of body or loss of life  Informed patient about role of CRNA in peri- and intra-operative care.  Patient voiced understanding.)        Anesthesia Quick Evaluation

## 2023-08-23 ENCOUNTER — Encounter: Payer: Self-pay | Admitting: Surgery

## 2023-08-23 LAB — SURGICAL PATHOLOGY

## 2023-08-23 NOTE — Progress Notes (Signed)
Post op call 08/23/23 @ 1030-daughter reports redness to one port site that was present yesterday after surgery and still present. Will keep doing ice and medications.  no warmth or drainage. informed her to mark it and if gets worse let MD know or if develops drainage, fever, warmth to touch also let him know. She verbalized understanding and will also message office if does not improve.

## 2023-08-28 ENCOUNTER — Encounter: Payer: Self-pay | Admitting: Surgery

## 2023-09-06 ENCOUNTER — Encounter: Payer: Medicaid Other | Admitting: Physician Assistant

## 2023-09-08 ENCOUNTER — Ambulatory Visit (INDEPENDENT_AMBULATORY_CARE_PROVIDER_SITE_OTHER): Payer: Medicaid Other | Admitting: Surgery

## 2023-09-08 ENCOUNTER — Encounter: Payer: Self-pay | Admitting: Surgery

## 2023-09-08 VITALS — BP 143/82 | HR 90 | Temp 98.1°F | Ht 66.0 in | Wt 168.4 lb

## 2023-09-08 DIAGNOSIS — Z09 Encounter for follow-up examination after completed treatment for conditions other than malignant neoplasm: Secondary | ICD-10-CM

## 2023-09-08 DIAGNOSIS — K8020 Calculus of gallbladder without cholecystitis without obstruction: Secondary | ICD-10-CM

## 2023-09-08 DIAGNOSIS — K3532 Acute appendicitis with perforation and localized peritonitis, without abscess: Secondary | ICD-10-CM

## 2023-09-08 NOTE — Patient Instructions (Signed)

## 2023-09-08 NOTE — Progress Notes (Signed)
09/08/2023  HPI: Jamie [name] is a 62 y.o. female s/p robotic assisted appendectomy and cholecystectomy on 08/22/2023.  Patient presents today for follow-up.  Pathology showed history of acute appendicitis and cholecystitis but otherwise no malignancy or atypia.  Patient reports that she has been doing well and denies any further right lower quadrant pain that she was having preoperatively.  Denies any nausea or vomiting or troubles with the incisions.  Vital signs: BP (!) 143/82 (BP Location: Right Arm, Patient Position: Sitting, Cuff Size: Small)   Pulse 90   Temp 98.1 F (36.7 C) (Oral)   Ht 5\' 6"  (1.676 m)   Wt 168 lb 6.4 oz (76.4 kg)   SpO2 100%   BMI 27.18 kg/m    Physical Exam: Constitutional: No acute distress Abdomen: Soft, nondistended, nontender to palpation.  Incisions are healing well and are clean, dry, intact with Dermabond peeling off in areas.  Assessment/Plan: This is a 62 y.o. female s/p robotic assisted appendectomy and cholecystectomy.  - The patient currently is healing well without any complications postoperatively.  Reminded her of activity restrictions for another 2 weeks.  Otherwise no dietary restrictions. - Follow-up as needed.   Howie Ill, MD [facility]

## 2024-03-02 IMAGING — MG MM DIGITAL SCREENING BILAT W/ TOMO AND CAD
6 of 10 series · 6 of 30 positions shown · non-contrast
Comparison: Previous exam(s).

ACR Breast Density Category a: The breast tissue is almost entirely
fatty.

CLINICAL DATA: Screening.

EXAM:
DIGITAL SCREENING BILATERAL MAMMOGRAM WITH TOMOSYNTHESIS AND CAD
TECHNIQUE: Bilateral screening digital craniocaudal and mediolateral oblique
mammograms were obtained. Bilateral screening digital breast
tomosynthesis was performed. The images were evaluated with
computer-aided detection.

[R CC synth-2D]
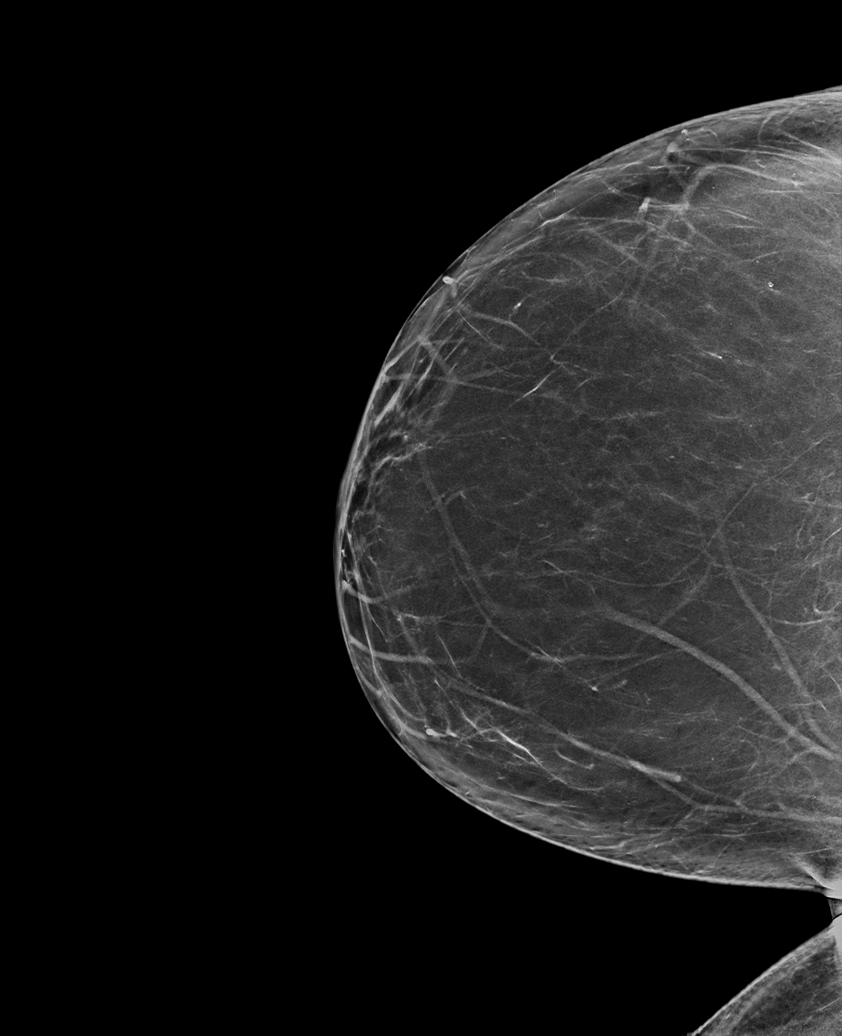

[R MLO synth-2D (1 of 2)]
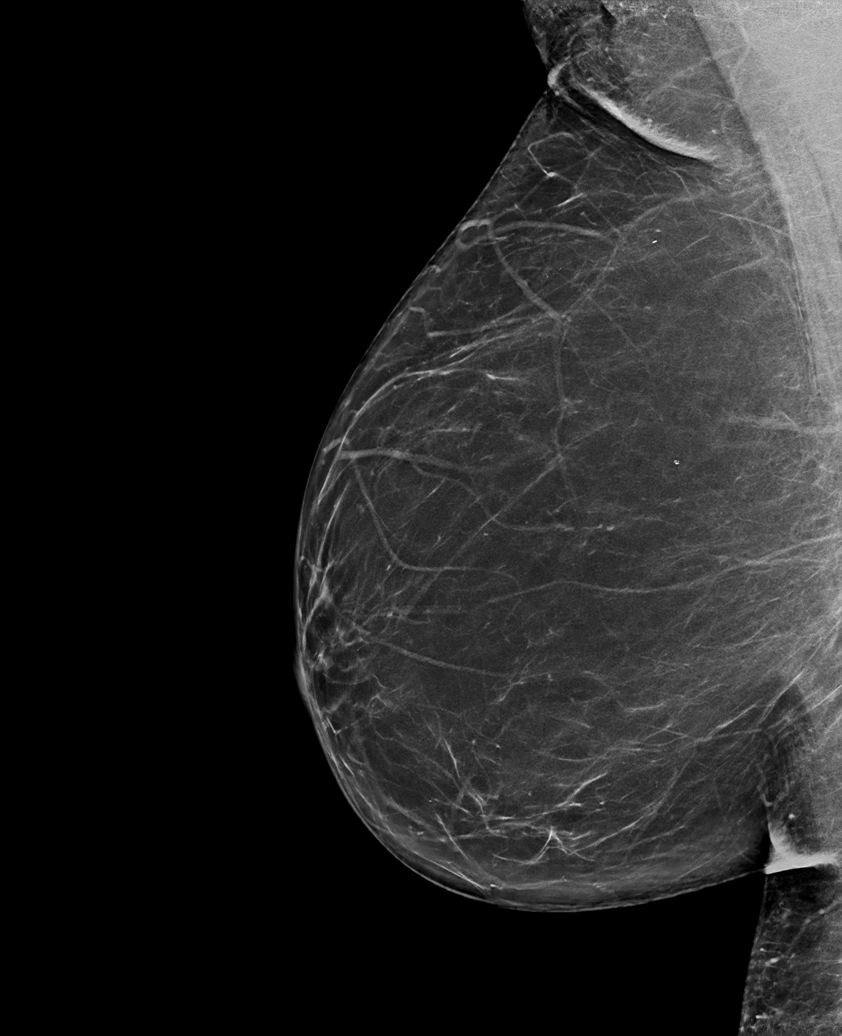

[L MLO synth-2D]
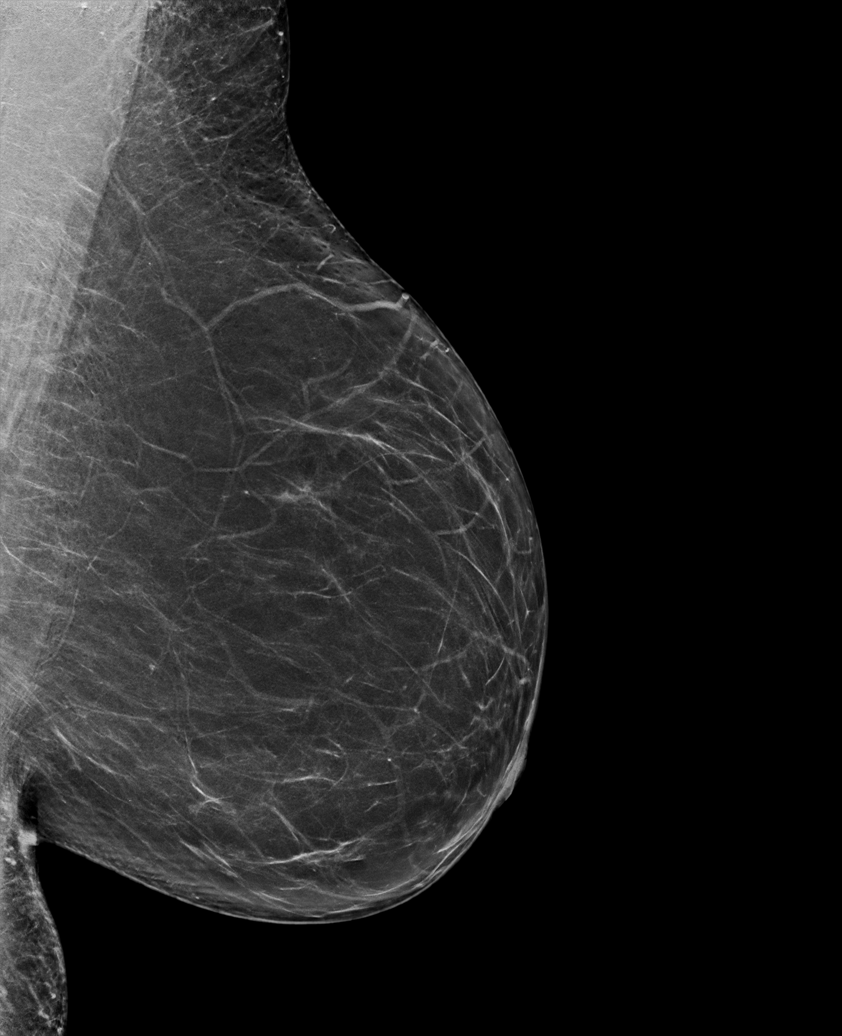

[L CC synth-2D]
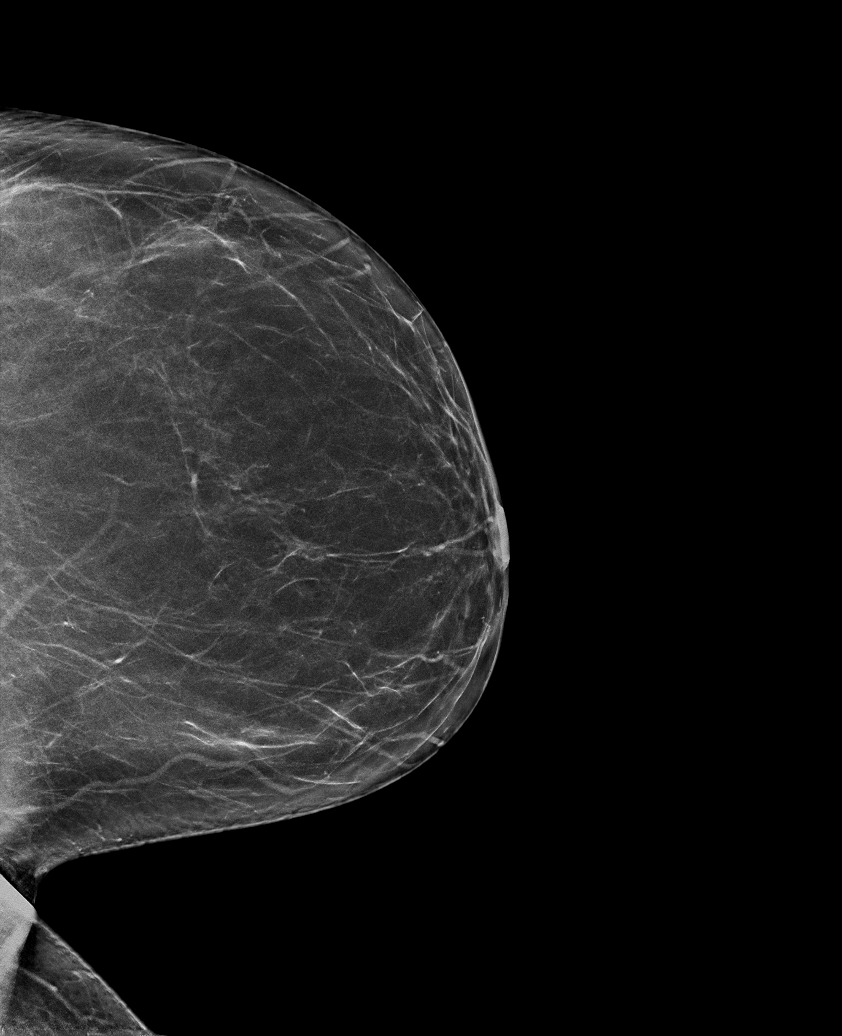

[R MLO synth-2D (2 of 2)]
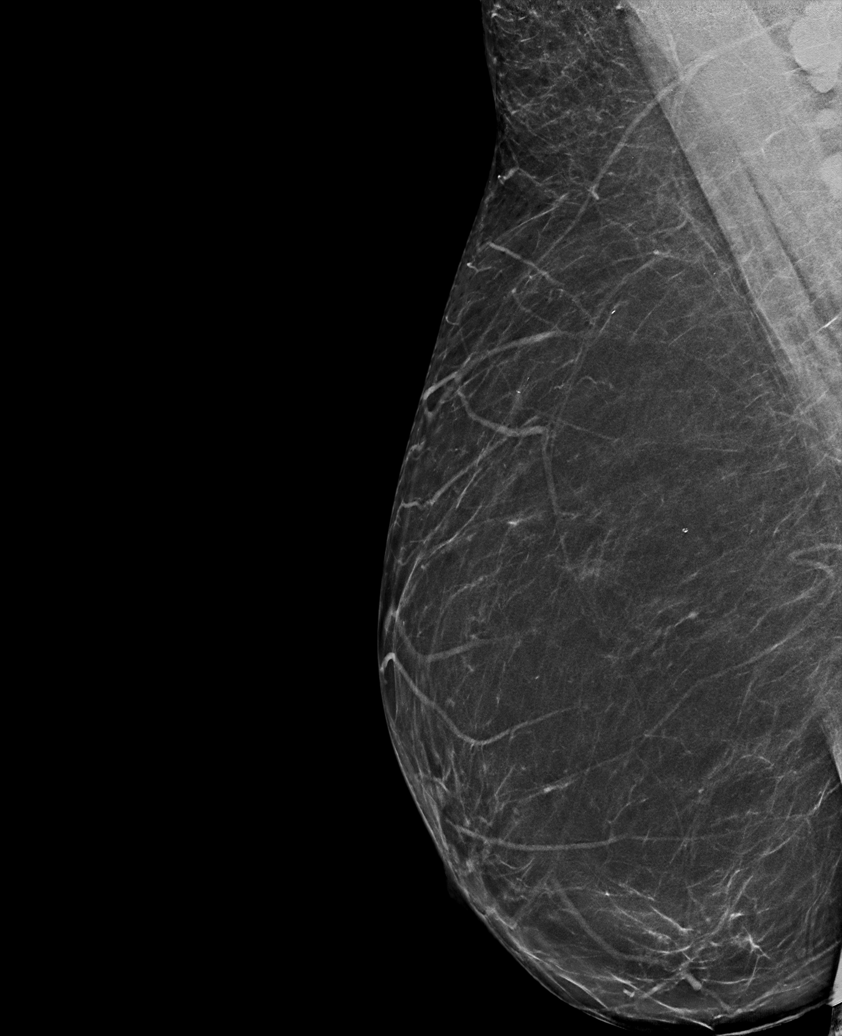

[R CC tomo · tomo slice 35/70.0]
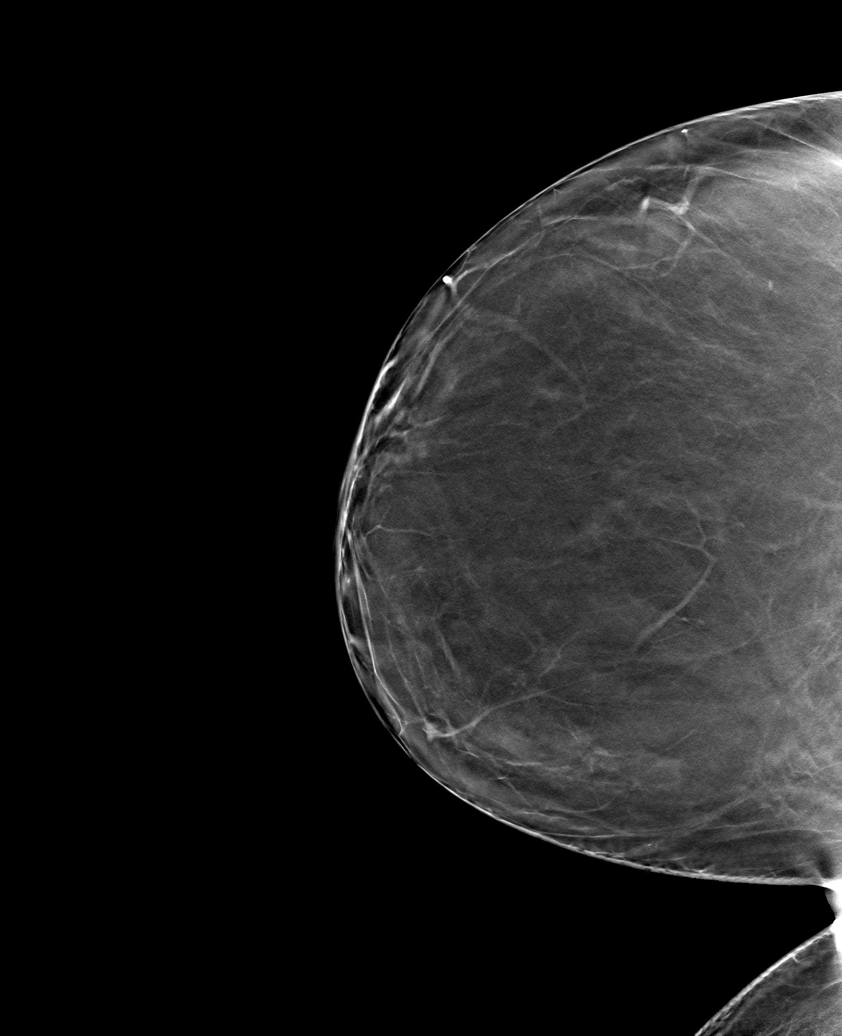

[6 of 30 positions shown; findings below may reference images not displayed]

FINDINGS: There are no findings suspicious for malignancy.
IMPRESSION: No mammographic evidence of malignancy. A result letter of this
screening mammogram will be mailed directly to the patient.

RECOMMENDATION:
Screening mammogram in one year. (Code:0E-3-N98)

BI-RADS CATEGORY  1: Negative.

## 2024-04-22 ENCOUNTER — Other Ambulatory Visit: Payer: Self-pay | Admitting: Nurse Practitioner

## 2024-04-22 DIAGNOSIS — Z1231 Encounter for screening mammogram for malignant neoplasm of breast: Secondary | ICD-10-CM

## 2024-05-09 ENCOUNTER — Encounter

## 2024-05-17 ENCOUNTER — Ambulatory Visit
Admission: RE | Admit: 2024-05-17 | Discharge: 2024-05-17 | Disposition: A | Discharge status: Home / Self Care | Source: Ambulatory Visit | Attending: Nurse Practitioner | Admitting: Nurse Practitioner

## 2024-05-17 DIAGNOSIS — Z1231 Encounter for screening mammogram for malignant neoplasm of breast: Secondary | ICD-10-CM | POA: Diagnosis present
